# Patient Record
Sex: Female | Born: 1935 | Race: Black or African American | Hispanic: No | Marital: Single | Smoking: Never smoker
Health system: Southern US, Community
[De-identification: ages and names within clinical notes are randomized; demographics above are authoritative.]

## PROBLEM LIST (undated history)

## (undated) DIAGNOSIS — I509 Heart failure, unspecified: Secondary | ICD-10-CM

## (undated) DIAGNOSIS — I10 Essential (primary) hypertension: Secondary | ICD-10-CM

## (undated) DIAGNOSIS — K219 Gastro-esophageal reflux disease without esophagitis: Secondary | ICD-10-CM

## (undated) DIAGNOSIS — Z8639 Personal history of other endocrine, nutritional and metabolic disease: Secondary | ICD-10-CM

## (undated) DIAGNOSIS — K8020 Calculus of gallbladder without cholecystitis without obstruction: Secondary | ICD-10-CM

## (undated) DIAGNOSIS — E7800 Pure hypercholesterolemia, unspecified: Secondary | ICD-10-CM

## (undated) DIAGNOSIS — E119 Type 2 diabetes mellitus without complications: Secondary | ICD-10-CM

## (undated) DIAGNOSIS — I4891 Unspecified atrial fibrillation: Secondary | ICD-10-CM

## (undated) HISTORY — PX: CORONARY ARTERY BYPASS GRAFT: SHX141

---

## 2013-10-21 ENCOUNTER — Inpatient Hospital Stay (HOSPITAL_BASED_OUTPATIENT_CLINIC_OR_DEPARTMENT_OTHER)
Admission: EM | Admit: 2013-10-21 | Discharge: 2013-11-12 | DRG: 296 | Disposition: E | Payer: Medicare Other | Attending: Internal Medicine | Admitting: Internal Medicine

## 2013-10-21 ENCOUNTER — Emergency Department (HOSPITAL_BASED_OUTPATIENT_CLINIC_OR_DEPARTMENT_OTHER): Payer: Medicare Other

## 2013-10-21 ENCOUNTER — Encounter (HOSPITAL_BASED_OUTPATIENT_CLINIC_OR_DEPARTMENT_OTHER): Payer: Self-pay | Admitting: Emergency Medicine

## 2013-10-21 ENCOUNTER — Inpatient Hospital Stay
Admission: RE | Admit: 2013-10-21 | Payer: Self-pay | Source: Other Acute Inpatient Hospital | Admitting: Family Medicine

## 2013-10-21 DIAGNOSIS — R1110 Vomiting, unspecified: Secondary | ICD-10-CM | POA: Diagnosis present

## 2013-10-21 DIAGNOSIS — J9600 Acute respiratory failure, unspecified whether with hypoxia or hypercapnia: Secondary | ICD-10-CM

## 2013-10-21 DIAGNOSIS — Y849 Medical procedure, unspecified as the cause of abnormal reaction of the patient, or of later complication, without mention of misadventure at the time of the procedure: Secondary | ICD-10-CM | POA: Diagnosis not present

## 2013-10-21 DIAGNOSIS — E785 Hyperlipidemia, unspecified: Secondary | ICD-10-CM | POA: Diagnosis present

## 2013-10-21 DIAGNOSIS — Y9210 Unspecified residential institution as the place of occurrence of the external cause: Secondary | ICD-10-CM | POA: Diagnosis not present

## 2013-10-21 DIAGNOSIS — E7800 Pure hypercholesterolemia, unspecified: Secondary | ICD-10-CM | POA: Diagnosis present

## 2013-10-21 DIAGNOSIS — R112 Nausea with vomiting, unspecified: Secondary | ICD-10-CM | POA: Diagnosis present

## 2013-10-21 DIAGNOSIS — Z951 Presence of aortocoronary bypass graft: Secondary | ICD-10-CM

## 2013-10-21 DIAGNOSIS — J95811 Postprocedural pneumothorax: Secondary | ICD-10-CM | POA: Diagnosis not present

## 2013-10-21 DIAGNOSIS — N390 Urinary tract infection, site not specified: Secondary | ICD-10-CM | POA: Diagnosis present

## 2013-10-21 DIAGNOSIS — I509 Heart failure, unspecified: Secondary | ICD-10-CM | POA: Diagnosis present

## 2013-10-21 DIAGNOSIS — Z7901 Long term (current) use of anticoagulants: Secondary | ICD-10-CM

## 2013-10-21 DIAGNOSIS — S270XXA Traumatic pneumothorax, initial encounter: Secondary | ICD-10-CM

## 2013-10-21 DIAGNOSIS — Z79899 Other long term (current) drug therapy: Secondary | ICD-10-CM

## 2013-10-21 DIAGNOSIS — I10 Essential (primary) hypertension: Secondary | ICD-10-CM | POA: Diagnosis present

## 2013-10-21 DIAGNOSIS — R570 Cardiogenic shock: Secondary | ICD-10-CM | POA: Diagnosis not present

## 2013-10-21 DIAGNOSIS — I469 Cardiac arrest, cause unspecified: Principal | ICD-10-CM

## 2013-10-21 DIAGNOSIS — R911 Solitary pulmonary nodule: Secondary | ICD-10-CM | POA: Diagnosis present

## 2013-10-21 DIAGNOSIS — Z8639 Personal history of other endocrine, nutritional and metabolic disease: Secondary | ICD-10-CM | POA: Diagnosis present

## 2013-10-21 DIAGNOSIS — I2510 Atherosclerotic heart disease of native coronary artery without angina pectoris: Secondary | ICD-10-CM | POA: Diagnosis present

## 2013-10-21 DIAGNOSIS — E119 Type 2 diabetes mellitus without complications: Secondary | ICD-10-CM | POA: Diagnosis present

## 2013-10-21 DIAGNOSIS — Z87891 Personal history of nicotine dependence: Secondary | ICD-10-CM

## 2013-10-21 DIAGNOSIS — I5032 Chronic diastolic (congestive) heart failure: Secondary | ICD-10-CM | POA: Diagnosis present

## 2013-10-21 DIAGNOSIS — D649 Anemia, unspecified: Secondary | ICD-10-CM | POA: Diagnosis present

## 2013-10-21 DIAGNOSIS — K219 Gastro-esophageal reflux disease without esophagitis: Secondary | ICD-10-CM | POA: Diagnosis present

## 2013-10-21 DIAGNOSIS — R7889 Finding of other specified substances, not normally found in blood: Secondary | ICD-10-CM | POA: Diagnosis present

## 2013-10-21 DIAGNOSIS — I4891 Unspecified atrial fibrillation: Secondary | ICD-10-CM | POA: Diagnosis present

## 2013-10-21 DIAGNOSIS — E872 Acidosis, unspecified: Secondary | ICD-10-CM | POA: Diagnosis present

## 2013-10-21 DIAGNOSIS — D509 Iron deficiency anemia, unspecified: Secondary | ICD-10-CM | POA: Diagnosis present

## 2013-10-21 DIAGNOSIS — R197 Diarrhea, unspecified: Secondary | ICD-10-CM | POA: Diagnosis present

## 2013-10-21 DIAGNOSIS — R5381 Other malaise: Secondary | ICD-10-CM | POA: Diagnosis present

## 2013-10-21 DIAGNOSIS — R319 Hematuria, unspecified: Secondary | ICD-10-CM | POA: Diagnosis present

## 2013-10-21 DIAGNOSIS — R17 Unspecified jaundice: Secondary | ICD-10-CM | POA: Diagnosis present

## 2013-10-21 DIAGNOSIS — E860 Dehydration: Secondary | ICD-10-CM | POA: Diagnosis present

## 2013-10-21 DIAGNOSIS — R5383 Other fatigue: Secondary | ICD-10-CM

## 2013-10-21 DIAGNOSIS — I059 Rheumatic mitral valve disease, unspecified: Secondary | ICD-10-CM | POA: Diagnosis present

## 2013-10-21 DIAGNOSIS — R531 Weakness: Secondary | ICD-10-CM

## 2013-10-21 DIAGNOSIS — K8020 Calculus of gallbladder without cholecystitis without obstruction: Secondary | ICD-10-CM | POA: Diagnosis present

## 2013-10-21 DIAGNOSIS — N179 Acute kidney failure, unspecified: Secondary | ICD-10-CM | POA: Diagnosis present

## 2013-10-21 DIAGNOSIS — R892 Abnormal level of other drugs, medicaments and biological substances in specimens from other organs, systems and tissues: Secondary | ICD-10-CM

## 2013-10-21 HISTORY — DX: Gastro-esophageal reflux disease without esophagitis: K21.9

## 2013-10-21 HISTORY — DX: Personal history of other endocrine, nutritional and metabolic disease: Z86.39

## 2013-10-21 HISTORY — DX: Essential (primary) hypertension: I10

## 2013-10-21 HISTORY — DX: Calculus of gallbladder without cholecystitis without obstruction: K80.20

## 2013-10-21 HISTORY — DX: Unspecified atrial fibrillation: I48.91

## 2013-10-21 HISTORY — DX: Heart failure, unspecified: I50.9

## 2013-10-21 HISTORY — DX: Type 2 diabetes mellitus without complications: E11.9

## 2013-10-21 HISTORY — DX: Pure hypercholesterolemia, unspecified: E78.00

## 2013-10-21 LAB — URINALYSIS, ROUTINE W REFLEX MICROSCOPIC
BILIRUBIN URINE: NEGATIVE
Glucose, UA: NEGATIVE mg/dL
KETONES UR: NEGATIVE mg/dL
NITRITE: NEGATIVE
PH: 5.5 (ref 5.0–8.0)
Protein, ur: 100 mg/dL — AB
Specific Gravity, Urine: 1.016 (ref 1.005–1.030)
Urobilinogen, UA: 1 mg/dL (ref 0.0–1.0)

## 2013-10-21 LAB — GLUCOSE, CAPILLARY
GLUCOSE-CAPILLARY: 133 mg/dL — AB (ref 70–99)
Glucose-Capillary: 111 mg/dL — ABNORMAL HIGH (ref 70–99)

## 2013-10-21 LAB — CBC WITH DIFFERENTIAL/PLATELET
Basophils Absolute: 0.1 10*3/uL (ref 0.0–0.1)
Basophils Relative: 2 % — ABNORMAL HIGH (ref 0–1)
EOS ABS: 0.3 10*3/uL (ref 0.0–0.7)
EOS PCT: 5 % (ref 0–5)
HEMATOCRIT: 26.7 % — AB (ref 36.0–46.0)
HEMOGLOBIN: 8.6 g/dL — AB (ref 12.0–15.0)
LYMPHS ABS: 1.1 10*3/uL (ref 0.7–4.0)
Lymphocytes Relative: 17 % (ref 12–46)
MCH: 31.6 pg (ref 26.0–34.0)
MCHC: 32.2 g/dL (ref 30.0–36.0)
MCV: 98.2 fL (ref 78.0–100.0)
MONO ABS: 1.1 10*3/uL — AB (ref 0.1–1.0)
MONOS PCT: 17 % — AB (ref 3–12)
NEUTROS PCT: 60 % (ref 43–77)
Neutro Abs: 3.9 10*3/uL (ref 1.7–7.7)
Platelets: 204 10*3/uL (ref 150–400)
RBC: 2.72 MIL/uL — AB (ref 3.87–5.11)
RDW: 17.3 % — ABNORMAL HIGH (ref 11.5–15.5)
WBC: 6.5 10*3/uL (ref 4.0–10.5)

## 2013-10-21 LAB — URINE MICROSCOPIC-ADD ON

## 2013-10-21 LAB — RETICULOCYTES
RBC.: 2.48 MIL/uL — AB (ref 3.87–5.11)
Retic Count, Absolute: 111.6 10*3/uL (ref 19.0–186.0)
Retic Ct Pct: 4.5 % — ABNORMAL HIGH (ref 0.4–3.1)

## 2013-10-21 LAB — COMPREHENSIVE METABOLIC PANEL
ALBUMIN: 3.5 g/dL (ref 3.5–5.2)
ALT: 19 U/L (ref 0–35)
AST: 34 U/L (ref 0–37)
Alkaline Phosphatase: 162 U/L — ABNORMAL HIGH (ref 39–117)
BUN: 22 mg/dL (ref 6–23)
CO2: 18 mEq/L — ABNORMAL LOW (ref 19–32)
CREATININE: 1.5 mg/dL — AB (ref 0.50–1.10)
Calcium: 9.1 mg/dL (ref 8.4–10.5)
Chloride: 102 mEq/L (ref 96–112)
GFR calc non Af Amer: 31 mL/min — ABNORMAL LOW (ref >90)
GFR, EST AFRICAN AMERICAN: 36 mL/min — AB (ref >90)
GLUCOSE: 146 mg/dL — AB (ref 70–99)
Potassium: 4.5 mEq/L (ref 3.7–5.3)
Sodium: 138 mEq/L (ref 137–147)
TOTAL PROTEIN: 7.8 g/dL (ref 6.0–8.3)
Total Bilirubin: 1.5 mg/dL — ABNORMAL HIGH (ref 0.3–1.2)

## 2013-10-21 LAB — LIPASE, BLOOD: LIPASE: 47 U/L (ref 11–59)

## 2013-10-21 LAB — OCCULT BLOOD X 1 CARD TO LAB, STOOL: FECAL OCCULT BLD: NEGATIVE

## 2013-10-21 LAB — DIGOXIN LEVEL: DIGOXIN LVL: 2.6 ng/mL — AB (ref 0.8–2.0)

## 2013-10-21 LAB — TROPONIN I

## 2013-10-21 LAB — PRO B NATRIURETIC PEPTIDE: Pro B Natriuretic peptide (BNP): 4933 pg/mL — ABNORMAL HIGH (ref 0–450)

## 2013-10-21 MED ORDER — SODIUM CHLORIDE 0.9 % IV BOLUS (SEPSIS)
1000.0000 mL | Freq: Once | INTRAVENOUS | Status: AC
  Administered 2013-10-21: 1000 mL via INTRAVENOUS

## 2013-10-21 MED ORDER — ONDANSETRON HCL 4 MG PO TABS: 4.0000 mg | ORAL_TABLET | Freq: Four times a day (QID) | ORAL | Status: DC | PRN

## 2013-10-21 MED ORDER — ATORVASTATIN CALCIUM 20 MG PO TABS
20.0000 mg | ORAL_TABLET | Freq: Every day | ORAL | Status: DC
  Administered 2013-10-21 – 2013-10-23 (×3): 20 mg via ORAL
  Filled 2013-10-21 (×4): qty 1

## 2013-10-21 MED ORDER — AMLODIPINE BESYLATE 10 MG PO TABS
10.0000 mg | ORAL_TABLET | Freq: Every day | ORAL | Status: DC
Start: 2013-10-22 — End: 2013-10-24
  Administered 2013-10-22 – 2013-10-23 (×2): 10 mg via ORAL
  Filled 2013-10-21 (×3): qty 1

## 2013-10-21 MED ORDER — INSULIN ASPART 100 UNIT/ML ~~LOC~~ SOLN
0.0000 [IU] | Freq: Three times a day (TID) | SUBCUTANEOUS | Status: DC
  Administered 2013-10-23: 13:00:00 1 [IU] via SUBCUTANEOUS

## 2013-10-21 MED ORDER — DOCUSATE SODIUM 100 MG PO CAPS
100.0000 mg | ORAL_CAPSULE | Freq: Two times a day (BID) | ORAL | Status: DC
  Administered 2013-10-21 – 2013-10-23 (×4): 100 mg via ORAL
  Filled 2013-10-21 (×7): qty 1

## 2013-10-21 MED ORDER — SODIUM CHLORIDE 0.9 % IJ SOLN: 3.0000 mL | Freq: Two times a day (BID) | INTRAMUSCULAR | Status: DC

## 2013-10-21 MED ORDER — ONDANSETRON HCL 4 MG/2ML IJ SOLN: 4.0000 mg | Freq: Three times a day (TID) | INTRAMUSCULAR | Status: DC | PRN

## 2013-10-21 MED ORDER — DEXTROSE 5 % IV SOLN
1.0000 g | INTRAVENOUS | Status: DC
  Administered 2013-10-21 – 2013-10-23 (×3): 1 g via INTRAVENOUS
  Filled 2013-10-21 (×3): qty 10

## 2013-10-21 MED ORDER — ONDANSETRON HCL 4 MG/2ML IJ SOLN
4.0000 mg | Freq: Four times a day (QID) | INTRAMUSCULAR | Status: DC | PRN
  Administered 2013-10-21 – 2013-10-23 (×7): 4 mg via INTRAVENOUS
  Filled 2013-10-21 (×8): qty 2

## 2013-10-21 MED ORDER — ACETAMINOPHEN 650 MG RE SUPP: 650.0000 mg | Freq: Four times a day (QID) | RECTAL | Status: DC | PRN

## 2013-10-21 MED ORDER — SODIUM CHLORIDE 0.9 % IV SOLN
INTRAVENOUS | Status: DC
Start: 2013-10-21 — End: 2013-10-21

## 2013-10-21 MED ORDER — ACETAMINOPHEN 325 MG PO TABS
650.0000 mg | ORAL_TABLET | Freq: Four times a day (QID) | ORAL | Status: DC | PRN
  Administered 2013-10-22: 650 mg via ORAL
  Filled 2013-10-21: qty 2

## 2013-10-21 MED ORDER — SODIUM CHLORIDE 0.9 % IV SOLN
INTRAVENOUS | Status: DC
  Administered 2013-10-21 – 2013-10-23 (×4): via INTRAVENOUS

## 2013-10-21 MED ORDER — ONDANSETRON HCL 4 MG/2ML IJ SOLN
4.0000 mg | Freq: Once | INTRAMUSCULAR | Status: AC
  Administered 2013-10-21: 4 mg via INTRAVENOUS
  Filled 2013-10-21: qty 2

## 2013-10-21 MED ORDER — METOPROLOL TARTRATE 50 MG PO TABS
50.0000 mg | ORAL_TABLET | Freq: Two times a day (BID) | ORAL | Status: DC
  Administered 2013-10-21 – 2013-10-23 (×4): 50 mg via ORAL
  Filled 2013-10-21 (×7): qty 1

## 2013-10-21 MED ORDER — PANTOPRAZOLE SODIUM 40 MG PO TBEC
40.0000 mg | DELAYED_RELEASE_TABLET | Freq: Every day | ORAL | Status: DC
  Administered 2013-10-21 – 2013-10-23 (×3): 40 mg via ORAL
  Filled 2013-10-21 (×4): qty 1

## 2013-10-21 MED ORDER — MONTELUKAST SODIUM 10 MG PO TABS
10.0000 mg | ORAL_TABLET | Freq: Every day | ORAL | Status: DC
  Administered 2013-10-21 – 2013-10-23 (×3): 10 mg via ORAL
  Filled 2013-10-21 (×4): qty 1

## 2013-10-21 MED ORDER — DABIGATRAN ETEXILATE MESYLATE 75 MG PO CAPS
75.0000 mg | ORAL_CAPSULE | Freq: Two times a day (BID) | ORAL | Status: DC
  Administered 2013-10-21 – 2013-10-23 (×5): 75 mg via ORAL
  Filled 2013-10-21 (×7): qty 1

## 2013-10-21 MED ORDER — BISACODYL 10 MG RE SUPP: 10.0000 mg | Freq: Every day | RECTAL | Status: DC | PRN

## 2013-10-21 NOTE — ED Provider Notes (Signed)
CSN: [id number]     Arrival date & time 07/24/2014  0941 History   First MD Initiated Contact with Patient 010/13/2015 1047     Chief Complaint  Patient presents with  . Weakness  . Emesis   (Consider location/radiation/quality/duration/timing/severity/associated sxs/prior Treatment) HPI Comments: 80 [place] recently hospitalized for low glu/ dehydration, staying with daughter presents with general weakness, decreased appetite and vomiting after meals.  No abd surgery or SBO hx.  Pt tolerating fluid but less.  No fevers.  No new medicines, had bp meds adjusted a few weeks back.  Pt on digoxin for a fib.  CABG hx.  No CHF hx or weight gain.  No leg swelling.  Sxs all the time.  Worse with eating.    Patient is a 80 y.o. female presenting with weakness and vomiting. The history is provided by the patient.  Weakness This is a recurrent problem. Associated symptoms include shortness of breath (mild). Pertinent negatives include no chest pain, no abdominal pain and no headaches.  Emesis Associated symptoms: arthralgias   Associated symptoms: no abdominal pain, no chills, no diarrhea and no headaches     Past Medical History  Diagnosis Date  . Diabetes mellitus without complication   . Hypertension   . GERD (gastroesophageal reflux disease)   . Atrial fibrillation   . High cholesterol    Past Surgical History  Procedure Laterality Date  . Coronary artery bypass graft     No family history on file. History  Substance Use Topics  . Smoking status: Never Smoker   . Smokeless tobacco: Not on file  . Alcohol Use: Not on file   OB History   Grav Para Term Preterm Abortions TAB SAB Ect Mult Living                 Review of Systems  Constitutional: Positive for appetite change and fatigue. Negative for fever and chills.  HENT: Negative for congestion.   Eyes: Negative for visual disturbance.  Respiratory: Positive for cough and shortness of breath (mild).    Cardiovascular: Negative for chest pain.  Gastrointestinal: Positive for nausea and vomiting. Negative for abdominal pain, diarrhea and blood in stool.  Genitourinary: Negative for dysuria and flank pain.  Musculoskeletal: Positive for arthralgias. Negative for back pain, neck pain and neck stiffness.  Skin: Negative for rash.  Neurological: Positive for weakness. Negative for light-headedness and headaches.    Allergies  Review of patient's allergies indicates no known allergies.  Home Medications   Current Outpatient Rx  Name  Route  Sig  Dispense  Refill  . amLODipine (NORVASC) 10 MG tablet   Oral   Take 10 mg by mouth daily.         Marland Kitchen. atorvastatin (LIPITOR) 20 MG tablet   Oral   Take 20 mg by mouth daily.         . dabigatran (PRADAXA) 75 MG CAPS capsule   Oral   Take 75 mg by mouth 2 (two) times daily.         . digoxin (LANOXIN) 0.125 MG tablet   Oral   Take by mouth daily.         . metoprolol (LOPRESSOR) 50 MG tablet   Oral   Take 50 mg by mouth 2 (two) times daily.         . montelukast (SINGULAIR) 10 MG tablet   Oral   Take 10 mg by mouth at bedtime.  BP 104/60  Pulse 55  Temp(Src) 97.5 F (36.4 C) (Oral)  Resp 14  Ht 5\' 4"  (1.626 m)  Wt 140 lb (63.504 kg)  BMI 24.02 kg/m2  SpO2 95% Physical Exam  Nursing note and vitals reviewed. Constitutional: She is oriented to person, place, and time. She appears well-developed and well-nourished.  HENT:  Head: Normocephalic and atraumatic.  Dry mm  Eyes: Right eye exhibits no discharge. Left eye exhibits no discharge. No scleral icterus.  Neck: Normal range of motion. Neck supple. No tracheal deviation present.  Cardiovascular: Normal rate.  An irregularly irregular rhythm present.  Pulmonary/Chest: Effort normal. She has rales (few at bases bilateral).  Abdominal: Soft. She exhibits distension (mild). There is no tenderness. There is no guarding.  Musculoskeletal: She exhibits no  edema.  Neurological: She is alert and oriented to person, place, and time. No cranial nerve deficit. GCS eye subscore is 4. GCS verbal subscore is 5. GCS motor subscore is 6.  Equal  strength in UE and LE with f/e at major joints. Sensation to palpation intact in UE and LE. CNs 2-12 grossly intact.  EOMFI.  PERRL.   Finger nose and coordination intact bilateral.   Visual fields intact to finger testing. General weakness mild   Skin: Skin is warm. No rash noted.  Psychiatric: She has a normal mood and affect.    ED Course  Procedures (including critical care time) Labs Review Labs Reviewed  URINALYSIS, ROUTINE W REFLEX MICROSCOPIC - Abnormal; Notable for the following:    Color, Urine AMBER (*)    APPearance CLOUDY (*)    Hgb urine dipstick LARGE (*)    Protein, ur 100 (*)    Leukocytes, UA SMALL (*)    All other components within normal limits  CBC WITH DIFFERENTIAL - Abnormal; Notable for the following:    RBC 2.72 (*)    Hemoglobin 8.6 (*)    HCT 26.7 (*)    RDW 17.3 (*)    Monocytes Relative 17 (*)    Monocytes Absolute 1.1 (*)    Basophils Relative 2 (*)    All other components within normal limits  COMPREHENSIVE METABOLIC PANEL - Abnormal; Notable for the following:    CO2 18 (*)    Glucose, Bld 146 (*)    Creatinine, Ser 1.50 (*)    Alkaline Phosphatase 162 (*)    Total Bilirubin 1.5 (*)    GFR calc non Af Amer 31 (*)    GFR calc Af Amer 36 (*)    All other components within normal limits  GLUCOSE, CAPILLARY - Abnormal; Notable for the following:    Glucose-Capillary 133 (*)    All other components within normal limits  DIGOXIN LEVEL - Abnormal; Notable for the following:    Digoxin Level 2.6 (*)    All other components within normal limits  URINE MICROSCOPIC-ADD ON - Abnormal; Notable for the following:    Squamous Epithelial / LPF MANY (*)    Bacteria, UA MANY (*)    All other components within normal limits  URINE CULTURE  LIPASE, BLOOD  OCCULT BLOOD X  1 CARD TO LAB, STOOL   Imaging Review Dg Chest 2 View  10/17/2013   CLINICAL DATA:  Cough. chest congestion. Fever. Nausea and vomiting. Generalized weakness. Current history of hypertension and diabetes. Prior CABG.  EXAM: CHEST  2 VIEW  COMPARISON:  None.  FINDINGS: Cardiac silhouette moderately enlarged. Thoracic aorta atherosclerotic. Hilar and mediastinal contours otherwise unremarkable. Nodular opacity projected  over the lateral left upper lobe on the PA image. Lungs otherwise clear. No localized airspace consolidation. No pleural effusions. No pneumothorax. Normal pulmonary vascularity. Degenerative changes involving the thoracic spine an generalized osseous demineralization.  IMPRESSION: 1. Moderate cardiomegaly.  No acute cardiopulmonary disease. 2. Nodular opacity projected over the inferior left upper lobe on the PA image. This may be in the lung or within the anterior 4th rib, posterior 7th rib, or scapula as all these structures are overlapping in this region. Are there prior examinations for comparison? If not, a non-emergent CT chest is recommended.   Electronically Signed   By: Hulan Saas M.D.   On: 11/08/2013 10:41   Dg Abd 2 Views  10/17/2013   CLINICAL DATA:  Vomiting, cough, congestion, fever, weakness  EXAM: ABDOMEN - 2 VIEW  COMPARISON:  10/25/2013 chest x-ray A  FINDINGS: Scattered air and stool throughout the bowel. Negative for obstruction. No free air. Calcified gallstones in the right upper quadrant. Cardiomegaly noted with vascular congestion and probable basilar atelectasis. Coronary bypass changes noted. Mild curvature of the spine appears positional.  IMPRESSION: Negative for obstruction or free air  Calcified gallstones   Electronically Signed   By: Ruel Favors M.D.   On: 11/06/2013 11:58    EKG Interpretation   None       MDM   1. Weakness   2. Nodule of left lung   3. Atrial fibrillation   4. ARF (acute renal failure)   5. Elevated bilirubin   6.  Dehydration   7. Vomiting   8. Elevated digoxin level   9. Gallstones    Broad differential with general weakness/ Vomiting in elderly female- cardiac/ infectious/ metabolic/ medicine SE/ partial bowel obstruction/ GB, other. General weakness with anemia, requesting old records to see if new. Hemoccult of stool.  Labs and fluids in ED. CXR small nodule, pt will need fup outpt for CT scan.   Fluids in ED.   Pt not improved on recheck. Multiple findings on work up including dehydration, arf, worsening anemia (old records showed recent 10.5), hemocult neg.  Imaging small nodule.   Dig level elevated, likely from dehydration.  Gallstones on xray, no fever or lft elevation, pt will need formal US at [facility]. Spoke with Dr Cena Benton, accepted tele.  The patients results and plan were reviewed and discussed.   Any x-rays performed were personally reviewed by myself.   Differential diagnosis were considered with the presenting HPI.  Diagnosis: above  EKG: a fib  Admission/ observation were discussed with the admitting physician, patient and/or family and they are comfortable with the plan.    Enid Skeens, MD 11/11/2013 [phone]

## 2013-10-21 NOTE — Progress Notes (Signed)
Patient 80 y/o with multiple comorbidities presenting with dry mucous membranes and nausea.  Has elevated Digoxin level, elevated serum creatine 1.5, and worsening anemia 8.6 from 10.5 on last check. ED physician requesting admission for further evaluation and recommendations.  Of note patient had calcified gallstones on x ray.  Please consider obtaining RUQ U/S on admission for further evaluation.  Adriella Essex, [employer]

## 2013-10-21 NOTE — ED Notes (Signed)
[facility] here and has assumed care of the patient. 

## 2013-10-21 NOTE — ED Notes (Signed)
Patient here with increased weakness and vomiting after meals. D[place] and was recently hospitalized fpr hypoglycemia, been staying with daughter the past week. Denies any abdominal pain. Also concerned with facial swelling

## 2013-10-21 NOTE — H&P (Signed)
History and Physical Examination   Sabrina Hurley OZH:[id number] DOB: 01-15-1928 DOA: 11/09/2013  PCP: Dicie Beam, MD   Chief Complaint: nausea   HPI: Sabrina Hurley is a 80 y.o. female from [facility] with type 2 diabetes mellitus, congestive heart failure, hypertension and chronic anemia presented to the med [facility] today with family because they became concerned that the patient has been nauseated with poor oral intake for the past several days.  The patient had recently been hospitalized in [facility] with hypoglycemia and dehydration.  She is recently been staying with her daughter who became concerned because of her confusion and malaise.  For the past week she has been having episodes of intermittent confusion and complaints of nausea.  She has also been retching.  No abd surgery or SBO hx.  No fevers. No new medicines, had bp meds adjusted a few weeks back because of chronic cough. Pt on digoxin for a fib. CABG hx.  No leg swelling.  The patient reports that her symptoms are worse when she eats and when she takes medications.  She denies having constipation, diarrhea and abdominal pain.  She reports that she has not seen her cardiologist in a very long time.  She has also experienced generalized weakness.  In the emergency department she was evaluated and found to have an acute renal failure, anemia with a 2 point drop in hemoglobin and an elevated digoxin level.  She was given IV fluid hydration and hospital and hospital admission was requested for further evaluation and management.  She was also noted to have cholelithiasis which will require further evaluation.  Past Medical History Past Medical History  Diagnosis Date  . Diabetes mellitus without complication   . Hypertension   . GERD (gastroesophageal reflux disease)   . Atrial fibrillation   . High cholesterol   . History of hypoglycemia   . CHF (congestive heart failure)   . Cholelithiasis     Past  Surgical History Past Surgical History  Procedure Laterality Date  . Coronary artery bypass graft      Home Meds: Prior to Admission medications   Medication Sig Start Date End Date Taking? Authorizing Provider  acetaminophen (TYLENOL) 500 MG tablet Take 1,000 mg by mouth every 6 (six) hours as needed.   Yes Historical Provider, MD  amLODipine (NORVASC) 10 MG tablet Take 10 mg by mouth daily.   Yes Historical Provider, MD  atorvastatin (LIPITOR) 20 MG tablet Take 20 mg by mouth daily.   Yes Historical Provider, MD  dabigatran (PRADAXA) 75 MG CAPS capsule Take 75 mg by mouth 2 (two) times daily.   Yes Historical Provider, MD  digoxin (LANOXIN) 0.125 MG tablet Take by mouth daily.   Yes Historical Provider, MD  glimepiride (AMARYL) 2 MG tablet Take 1 tablet by mouth daily. 09/13/13  Yes Historical Provider, MD  JANUVIA 100 MG tablet Take 1 tablet by mouth daily. 09/12/13  Yes Historical Provider, MD  metoprolol (LOPRESSOR) 50 MG tablet Take 50 mg by mouth 2 (two) times daily.   Yes Historical Provider, MD  montelukast (SINGULAIR) 10 MG tablet Take 10 mg by mouth at bedtime.   Yes Historical Provider, MD  DEXILANT 30 MG capsule Take 1 capsule by mouth daily. 10/17/13   Historical Provider, MD   Allergies: Review of patient's allergies indicates no known allergies.  Social History:  History   Social History  . Marital Status: Single    Spouse Name: N/A    Number of Children:  N/A  . Years of Education: N/A   Occupational History  . Not on file.   Social History Main Topics  . Smoking status: Never Smoker   . Smokeless tobacco: Not on file  . Alcohol Use: Not on file  . Drug Use: Not on file  . Sexual Activity: Not on file   Other Topics Concern  . Not on file   Social History Narrative   The patient lives in [place] and is currently visiting daughter   Family History:  Family History  Problem Relation Age of Onset  . Hypertension      Review of Systems   Constitutional: Positive for appetite change and fatigue. Negative for fever and chills.  HENT: Negative for congestion.  Eyes: Negative for visual disturbance.  Respiratory: Positive for cough and shortness of breath (mild).  Cardiovascular: Negative for chest pain.  Gastrointestinal: Positive for nausea and vomiting. Negative for abdominal pain, diarrhea and blood in stool.  Genitourinary: Negative for dysuria and flank pain.  Musculoskeletal: Positive for arthralgias. Negative for back pain, neck pain and neck stiffness.  Skin: Negative for rash.  Neurological: Positive for weakness. Negative for light-headedness and headaches.  All other systems reviewed and reported as negative.   Physical Exam: Blood pressure 103/59, pulse 76, temperature 97.6 F (36.4 C), temperature source Oral, resp. rate 16, height 5\' 4"  (1.626 m), weight 146 lb 13.2 oz (66.6 kg), SpO2 100.00%. Nursing note and vitals reviewed.  Constitutional: She is oriented to person, place, and time. She appears well-developed and well-nourished.  HENT: Head: Normocephalic and atraumatic. Dry mm  Eyes: Right eye exhibits no discharge. Left eye exhibits no discharge. No scleral icterus.  Neck: Normal range of motion. Neck supple. No tracheal deviation present.  Cardiovascular: Normal rate. An irregularly irregular rhythm present.  Pulmonary/Chest: Effort normal. She has rales (few at bases bilateral).  Abdominal: Soft. She exhibits distension (mild). There is no tenderness. There is no guarding.  Musculoskeletal: She exhibits no edema.  Neurological: She is alert and oriented to person, place, and time. No cranial nerve deficit. Equal strength in UE and LE with f/e at major joints. Sensation to palpation intact in UE and LE. CNs 2-12 grossly intact. EOMI. PERRL.  Finger nose and coordination intact bilateral.  General weakness mild Skin: Skin is warm. No rash noted.  Psychiatric: She has a normal mood and affect.   Lab   And Imaging results:  Results for orders placed during the hospital encounter of 11-19-2013 (from the past 24 hour(s))  URINALYSIS, ROUTINE W REFLEX MICROSCOPIC     Status: Abnormal   Collection Time    11-19-13 10:09 AM      Result Value Range   Color, Urine AMBER (*) YELLOW   APPearance CLOUDY (*) CLEAR   Specific Gravity, Urine 1.016  1.005 - 1.030   pH 5.5  5.0 - 8.0   Glucose, UA NEGATIVE  NEGATIVE mg/dL   Hgb urine dipstick LARGE (*) NEGATIVE   Bilirubin Urine NEGATIVE  NEGATIVE   Ketones, ur NEGATIVE  NEGATIVE mg/dL   Protein, ur 454 (*) NEGATIVE mg/dL   Urobilinogen, UA 1.0  0.0 - 1.0 mg/dL   Nitrite NEGATIVE  NEGATIVE   Leukocytes, UA SMALL (*) NEGATIVE  URINE MICROSCOPIC-ADD ON     Status: Abnormal   Collection Time    Nov 19, 2013 10:09 AM      Result Value Range   Squamous Epithelial / LPF MANY (*) RARE   WBC, UA 3-6  <  3 WBC/hpf   RBC / HPF TOO NUMEROUS TO COUNT  <3 RBC/hpf   Bacteria, UA MANY (*) RARE  GLUCOSE, CAPILLARY     Status: Abnormal   Collection Time    2013-11-03 10:14 AM      Result Value Range   Glucose-Capillary 133 (*) 70 - 99 mg/dL   Comment 1 Documented in Chart     Comment 2 Notify RN    CBC WITH DIFFERENTIAL     Status: Abnormal   Collection Time    2013/11/03 10:35 AM      Result Value Range   WBC 6.5  4.0 - 10.5 K/uL   RBC 2.72 (*) 3.87 - 5.11 MIL/uL   Hemoglobin 8.6 (*) 12.0 - 15.0 g/dL   HCT 16.1 (*) 09.6 - 04.5 %   MCV 98.2  78.0 - 100.0 fL   MCH 31.6  26.0 - 34.0 pg   MCHC 32.2  30.0 - 36.0 g/dL   RDW 40.9 (*) 81.1 - 91.4 %   Platelets 204  150 - 400 K/uL   Neutrophils Relative % 60  43 - 77 %   Neutro Abs 3.9  1.7 - 7.7 K/uL   Lymphocytes Relative 17  12 - 46 %   Lymphs Abs 1.1  0.7 - 4.0 K/uL   Monocytes Relative 17 (*) 3 - 12 %   Monocytes Absolute 1.1 (*) 0.1 - 1.0 K/uL   Eosinophils Relative 5  0 - 5 %   Eosinophils Absolute 0.3  0.0 - 0.7 K/uL   Basophils Relative 2 (*) 0 - 1 %   Basophils Absolute 0.1  0.0 - 0.1 K/uL   COMPREHENSIVE METABOLIC PANEL     Status: Abnormal   Collection Time    03-Nov-2013 10:35 AM      Result Value Range   Sodium 138  137 - 147 mEq/L   Potassium 4.5  3.7 - 5.3 mEq/L   Chloride 102  96 - 112 mEq/L   CO2 18 (*) 19 - 32 mEq/L   Glucose, Bld 146 (*) 70 - 99 mg/dL   BUN 22  6 - 23 mg/dL   Creatinine, Ser 7.82 (*) 0.50 - 1.10 mg/dL   Calcium 9.1  8.4 - 95.6 mg/dL   Total Protein 7.8  6.0 - 8.3 g/dL   Albumin 3.5  3.5 - 5.2 g/dL   AST 34  0 - 37 U/L   ALT 19  0 - 35 U/L   Alkaline Phosphatase 162 (*) 39 - 117 U/L   Total Bilirubin 1.5 (*) 0.3 - 1.2 mg/dL   GFR calc non Af Amer 31 (*) >90 mL/min   GFR calc Af Amer 36 (*) >90 mL/min  LIPASE, BLOOD     Status: None   Collection Time    November 03, 2013 10:35 AM      Result Value Range   Lipase 47  11 - 59 U/L  DIGOXIN LEVEL     Status: Abnormal   Collection Time    11/03/2013 10:35 AM      Result Value Range   Digoxin Level 2.6 (*) 0.8 - 2.0 ng/mL  OCCULT BLOOD X 1 CARD TO LAB, STOOL     Status: None   Collection Time    2013/11/03 12:00 PM      Result Value Range   Fecal Occult Bld NEGATIVE  NEGATIVE     Impression / Plan    ARF (acute renal failure) / Dehydration - will treat with gentle  IV fluid hydration, monitor ins and outs, daily weights and electrolytes daily   Nausea and Vomiting - IV nausea medications ordered   Atrial fibrillation - rate currently controlled, anticoagulated with Pradaxa, will hold digoxin, repeat level in a.m. after IV fluid hydration   Hematuria - urine culture ordered, will empirically start IV ceftriaxone pending culture results   Weakness-consult PT / OT for evaluation and management recommendations   Anemia - hemoccult stool x 3, monitor CBC, check anemia panel    Gallstones - check U/S of abdomen    Nodule of left lung - discussed with patient, will need outpatient followup imaging with PCP    Elevated bilirubin - recheck in AM, ABD US order as above    Elevated digoxin level - mildly elevated  in setting of ARF and dehydration, will hydrate now, repeat level in AM, hold dig for now   CHF (congestive heart failure) per patient history, monitor I/O, weights, fluids, check echocardiogram    History of hypoglycemia - monitor CBG closely   High cholesterol - check fasting lipid panel    GERD (gastroesophageal reflux disease) - protonix daily for GI prophylaxis    Hypertension - resume home medications    Type 2 diabetes mellitus - close monitoring of CBG as above, supplemental insulin as needed for elevated BS readings  Standley Dakinslanford Alekzander Cardell MD [facility] [place] [phone] 05-12-2014, 5:07 PM

## 2013-10-22 ENCOUNTER — Inpatient Hospital Stay (HOSPITAL_COMMUNITY): Payer: Medicare Other

## 2013-10-22 DIAGNOSIS — I059 Rheumatic mitral valve disease, unspecified: Secondary | ICD-10-CM

## 2013-10-22 LAB — TROPONIN I: Troponin I: <0.3 ng/mL (ref <0.30)

## 2013-10-22 LAB — IRON AND TIBC
IRON: 25 ug/dL — AB (ref 42–135)
SATURATION RATIOS: 8 % — AB (ref 20–55)
TIBC: 304 ug/dL (ref 250–470)
UIBC: 279 ug/dL (ref 125–400)

## 2013-10-22 LAB — CBC
HEMATOCRIT: 23.4 % — AB (ref 36.0–46.0)
Hemoglobin: 8 g/dL — ABNORMAL LOW (ref 12.0–15.0)
MCH: 32.5 pg (ref 26.0–34.0)
MCHC: 34.2 g/dL (ref 30.0–36.0)
MCV: 95.1 fL (ref 78.0–100.0)
Platelets: 195 10*3/uL (ref 150–400)
RBC: 2.46 MIL/uL — ABNORMAL LOW (ref 3.87–5.11)
RDW: 17.8 % — AB (ref 11.5–15.5)
WBC: 6.5 10*3/uL (ref 4.0–10.5)

## 2013-10-22 LAB — GLUCOSE, CAPILLARY
GLUCOSE-CAPILLARY: 104 mg/dL — AB (ref 70–99)
Glucose-Capillary: 95 mg/dL (ref 70–99)
Glucose-Capillary: 99 mg/dL (ref 70–99)
Glucose-Capillary: 123 mg/dL — ABNORMAL HIGH (ref 70–99)

## 2013-10-22 LAB — COMPREHENSIVE METABOLIC PANEL
ALBUMIN: 2.9 g/dL — AB (ref 3.5–5.2)
ALK PHOS: 137 U/L — AB (ref 39–117)
ALT: 17 U/L (ref 0–35)
AST: 29 U/L (ref 0–37)
BUN: 25 mg/dL — ABNORMAL HIGH (ref 6–23)
CO2: 19 mEq/L (ref 19–32)
Calcium: 8.3 mg/dL — ABNORMAL LOW (ref 8.4–10.5)
Chloride: 107 mEq/L (ref 96–112)
Creatinine, Ser: 1.75 mg/dL — ABNORMAL HIGH (ref 0.50–1.10)
GFR calc Af Amer: 30 mL/min — ABNORMAL LOW (ref >90)
GFR calc non Af Amer: 26 mL/min — ABNORMAL LOW (ref >90)
Glucose, Bld: 101 mg/dL — ABNORMAL HIGH (ref 70–99)
POTASSIUM: 4.9 meq/L (ref 3.7–5.3)
Sodium: 138 mEq/L (ref 137–147)
Total Bilirubin: 1.6 mg/dL — ABNORMAL HIGH (ref 0.3–1.2)
Total Protein: 6.8 g/dL (ref 6.0–8.3)

## 2013-10-22 LAB — VITAMIN B12

## 2013-10-22 LAB — HEMOGLOBIN A1C
HEMOGLOBIN A1C: 5.9 % — AB (ref <5.7)
Mean Plasma Glucose: 123 mg/dL — ABNORMAL HIGH (ref <117)

## 2013-10-22 LAB — URINE CULTURE
COLONY COUNT: NO GROWTH
CULTURE: NO GROWTH

## 2013-10-22 LAB — TSH: TSH: 2.559 u[IU]/mL (ref 0.350–4.500)

## 2013-10-22 LAB — DIGOXIN LEVEL: Digoxin Level: 2.1 ng/mL — ABNORMAL HIGH (ref 0.8–2.0)

## 2013-10-22 LAB — FERRITIN: Ferritin: 35 ng/mL (ref 10–291)

## 2013-10-22 LAB — FOLATE

## 2013-10-22 NOTE — Evaluation (Signed)
Physical Therapy Evaluation Patient Details Name: Sabrina Hurley MRN: [id number] DOB: [birth year] Today's Date: 10/22/2013 Time: 4098-1191 PT Time Calculation (min): 18 min  PT Assessment / Plan / Recommendation History of Present Illness  80 y.o. female from [facility] with type 2 diabetes mellitus, congestive heart failure, hypertension and chronic anemia presented to the med [employer] [facility] today with family because they became concerned that the patient has been nauseated with poor oral intake for the past several days.  The patient had recently been hospitalized in [facility] with hypoglycemia and dehydration.  She is recently been staying with her daughter who became concerned because of her confusion and malaise.  For the past week she has been having episodes of intermittent confusion and complaints of nausea.   Clinical Impression  Pt admitted with diagnosis of acute renal failure. Pt currently with functional limitations due to the deficits listed below (see PT Problem List). Pt will benefit from skilled PT to increase their independence and safety with mobility to allow discharge to the venue listed below.  Pt and daughter will be discussing d/c plans.  Daughter can provide 24 hour A if pt d/c to her house.       PT Assessment  Patient needs continued PT services    Follow Up Recommendations  Home health PT    Does the patient have the potential to tolerate intense rehabilitation      Barriers to Discharge        Equipment Recommendations  Rolling walker with 5" wheels    Recommendations for Other Services     Frequency Min 3X/week    Precautions / Restrictions Precautions Precautions: Fall Restrictions Weight Bearing Restrictions: No   Pertinent Vitals/Pain No pain      Mobility  Bed Mobility Overal bed mobility: Needs Assistance Bed Mobility: Sit to Supine Sit to supine: Supervision Transfers Overall transfer level: Needs  assistance Transfers: Sit to/from Stand Sit to Stand: Min guard General transfer comment: cueing for proper hand placement Ambulation/Gait Ambulation/Gait assistance: Min guard;Min assist Ambulation Distance (Feet): 90 Feet Assistive device:  (pushing IV pole) Gait Pattern/deviations: Decreased step length - right;Decreased step length - left;Drifts right/left (drifting when she didn't have IV pole) General Gait Details: Pt ambulated with IV pole, but did a trial without IV pole and pt with decreased balance and pt ended up reaching for rails.      Exercises     PT Diagnosis: Difficulty walking  PT Problem List: Decreased activity tolerance;Decreased balance;Decreased mobility PT Treatment Interventions: Gait training;Stair training;Functional mobility training;Therapeutic activities;Therapeutic exercise     PT Goals(Current goals can be found in the care plan section) Acute Rehab PT Goals Patient Stated Goal: To get stronger.  Daughter would like pt to go home with her. PT Goal Formulation: With patient/family Time For Goal Achievement: 11/05/13 Potential to Achieve Goals: Good  Visit Information  Last PT Received On: 10/22/13 Assistance Needed: +1 History of Present Illness: 80 y.o. female from [facility] with type 2 diabetes mellitus, congestive heart failure, hypertension and chronic anemia presented to the med [employer] today with family because they became concerned that the patient has been nauseated with poor oral intake for the past several days.  The patient had recently been hospitalized in [facility] with hypoglycemia and dehydration.  She is recently been staying with her daughter who became concerned because of her confusion and malaise.  For the past week she has been having episodes of intermittent confusion and complaints  of nausea.        Prior Functioning       Cognition  Cognition Arousal/Alertness: Awake/alert Behavior During Therapy:  WFL for tasks assessed/performed Overall Cognitive Status: Within Functional Limits for tasks assessed    Extremity/Trunk Assessment Upper Extremity Assessment Upper Extremity Assessment: Defer to OT evaluation Lower Extremity Assessment Lower Extremity Assessment: Generalized weakness Cervical / Trunk Assessment Cervical / Trunk Assessment: Normal   Balance Balance Overall balance assessment: Needs assistance Standing balance-Leahy Scale: Fair  End of Session PT - End of Session Equipment Utilized During Treatment: Gait belt Activity Tolerance: Patient tolerated treatment well Patient left: in bed;with call bell/phone within reach;with family/visitor present Nurse Communication: Mobility status  GP     Sabrina Hurley 10/22/2013, 10:46 AM

## 2013-10-22 NOTE — Progress Notes (Addendum)
[facility] PROGRESS NOTE  Sabrina Hurley ZOX:[id number] DOB: 1928-02-28 DOA: 10/27/2013 PCP: Dicie BeamMANGRUM,CHARLITA, MD  Assessment/Plan: ARF (acute renal failure) / Dehydration - cont IV fluid hydration as tolerated, monitor ins and outs, daily weights and electrolytes daily  - Creatinine continues to rise in the setting of active diarrhea - Will cautiously increase IVF from 75cc to 100cc/hr Nausea and Vomiting - PRN anitemetics Atrial fibrillation - rate currently controlled, anticoagulated with Pradaxa, holding digoxin Hematuria - urine culture ordered with no growth noted, empirically started IV ceftriaxone, consider d/c abx given neg culture, no fevers, no leukocytosis Weakness-PT / OT consulted  Iron deficiency Anemia - hemoccult stool x 3, follow CBC, Iron of 25. Will start oral iron supplimentation  Gallstones - noted on abd US without signs of obstruction or Murphy's sign, Surgery consulted Nodule of left lung - discussed with patient, will need outpatient followup imaging with PCP  Elevated bilirubin - stable, ABD US order as above  Elevated digoxin level - mildly elevated in setting of ARF and dehydration, will hydrate now,  cont to monitor, would resume digoxin when levels therapeutic CHF (congestive heart failure) per patient history, monitor I/O, weights, fluids, check echocardiogram, mod to severe MR with normal EF noted History of hypoglycemia - monitor CBG closely  High cholesterol - check fasting lipid panel  GERD (gastroesophageal reflux disease) - protonix daily for GI prophylaxis  Hypertension - resume home medications  Type 2 diabetes mellitus - close monitoring of CBG as above, supplemental insulin as needed for elevated BS readings Diarrhea - unclear etiology - doubt cdiff given lack of fever, abd tenderness, and no leukocytosis - supportive care for now with IVF as tolerated - Appears to be improving  Code Status: Full Family Communication: Pt in room  (indicate person spoken with, relationship, and if by phone, the number) Disposition Plan: Pending  Consultants:  Surgery  Procedures:  none  Antibiotics:  none   HPI/Subjective: No acute events noted overnight. Still having loose stools  Objective: Filed Vitals:   10/20/2013 1345 11/03/2013 1552 10/29/2013 2130 10/22/13 0615  BP: 112/69 103/59 103/49 116/58  Pulse:  76 63 63  Temp: 97.6 F (36.4 C) 97.6 F (36.4 C) 98 F (36.7 C) 98.3 F (36.8 C)  TempSrc: Oral Oral Oral Oral  Resp: 15 16 18 16   Height: 5\' 4"  (1.626 m) 5\' 4"  (1.626 m)    Weight: 66.6 kg (146 lb 13.2 oz) 66.6 kg (146 lb 13.2 oz)    SpO2: 97% 100% 96% 99%    Intake/Output Summary (Last 24 hours) at 10/22/13 1051 Last data filed at 10/22/13 0657  Gross per 24 hour  Intake 1116.25 ml  Output    100 ml  Net 1016.25 ml   Filed Weights   11/07/2013 0959 10/28/2013 1345 11/09/2013 1552  Weight: 63.504 kg (140 lb) 66.6 kg (146 lb 13.2 oz) 66.6 kg (146 lb 13.2 oz)    Exam:   General:  Awake, in nad  Cardiovascular: regular, s1, s2  Respiratory: normal resp effort, no wheezing  Abdomen: soft, nondistended  Musculoskeletal: perfused, no clubbing   Data Reviewed: Basic Metabolic Panel:  Recent Labs Lab 11/10/2013 1035 10/22/13 0536  NA 138 138  K 4.5 4.9  CL 102 107  CO2 18* 19  GLUCOSE 146* 101*  BUN 22 25*  CREATININE 1.50* 1.75*  CALCIUM 9.1 8.3*   Liver Function Tests:  Recent Labs Lab 11/05/2013 1035 10/22/13 0536  AST 34 29  ALT 19 17  ALKPHOS  162* 137*  BILITOT 1.5* 1.6*  PROT 7.8 6.8  ALBUMIN 3.5 2.9*    Recent Labs Lab 11-06-13 1035  LIPASE 47   No results found for this basename: AMMONIA,  in the last 168 hours CBC:  Recent Labs Lab November 06, 2013 1035 10/22/13 0536  WBC 6.5 6.5  NEUTROABS 3.9  --   HGB 8.6* 8.0*  HCT 26.7* 23.4*  MCV 98.2 95.1  PLT 204 195   Cardiac Enzymes:  Recent Labs Lab 11-06-2013 1905 11/06/2013 2328 10/22/13 0536  TROPONINI <0.30 <0.30  <0.30   BNP (last 3 results)  Recent Labs  2013-11-06 1905  PROBNP 4933.0*   CBG:  Recent Labs Lab 06-Nov-2013 1014 Nov 06, 2013 2127 10/22/13 0818  GLUCAP 133* 111* 95    No results found for this or any previous visit (from the past 240 hour(s)).   Studies: Dg Chest 2 View  11-06-2013   CLINICAL DATA:  Cough. chest congestion. Fever. Nausea and vomiting. Generalized weakness. Current history of hypertension and diabetes. Prior CABG.  EXAM: CHEST  2 VIEW  COMPARISON:  None.  FINDINGS: Cardiac silhouette moderately enlarged. Thoracic aorta atherosclerotic. Hilar and mediastinal contours otherwise unremarkable. Nodular opacity projected over the lateral left upper lobe on the PA image. Lungs otherwise clear. No localized airspace consolidation. No pleural effusions. No pneumothorax. Normal pulmonary vascularity. Degenerative changes involving the thoracic spine an generalized osseous demineralization.  IMPRESSION: 1. Moderate cardiomegaly.  No acute cardiopulmonary disease. 2. Nodular opacity projected over the inferior left upper lobe on the PA image. This may be in the lung or within the anterior 4th rib, posterior 7th rib, or scapula as all these structures are overlapping in this region. Are there prior examinations for comparison? If not, a non-emergent CT chest is recommended.   Electronically Signed   By: Hulan Saas M.D.   On: 11/06/2013 10:41   Dg Abd 2 Views  11/06/13   CLINICAL DATA:  Vomiting, cough, congestion, fever, weakness  EXAM: ABDOMEN - 2 VIEW  COMPARISON:  2013-11-06 chest x-ray A  FINDINGS: Scattered air and stool throughout the bowel. Negative for obstruction. No free air. Calcified gallstones in the right upper quadrant. Cardiomegaly noted with vascular congestion and probable basilar atelectasis. Coronary bypass changes noted. Mild curvature of the spine appears positional.  IMPRESSION: Negative for obstruction or free air  Calcified gallstones   Electronically Signed    By: Ruel Favors M.D.   On: Nov 06, 2013 11:58    Scheduled Meds: . amLODipine  10 mg Oral Daily  . atorvastatin  20 mg Oral Daily  . cefTRIAXone (ROCEPHIN)  IV  1 g Intravenous Q24H  . dabigatran  75 mg Oral BID  . docusate sodium  100 mg Oral BID  . insulin aspart  0-9 Units Subcutaneous TID WC  . metoprolol  50 mg Oral BID  . montelukast  10 mg Oral QHS  . pantoprazole  40 mg Oral Daily  . sodium chloride  3 mL Intravenous Q12H   Continuous Infusions: . sodium chloride 75 mL/hr at 10/22/13 9811    Principal Problem:   ARF (acute renal failure) Active Problems:   Vomiting   Atrial fibrillation   Dehydration   Weakness   Anemia   Gallstones   Nodule of left lung   Elevated bilirubin   Elevated digoxin level   CHF (congestive heart failure)   History of hypoglycemia   High cholesterol   GERD (gastroesophageal reflux disease)   Hypertension   Type 2 diabetes mellitus  Hematuria   UTI (urinary tract infection)   CAD (coronary atherosclerotic disease)   S/P CABG (coronary artery bypass graft)  Time spent:  CHIU, STEPHEN K  [facility] Pager [phone]. If 7PM-7AM, please contact night-coverage at [email or web address], password [facility] 10/22/2013, 10:51 AM  LOS: 1 day

## 2013-10-22 NOTE — Progress Notes (Signed)
Echocardiogram 2D Echocardiogram has been performed.  Sabrina Hurley, Sabrina Hurley 10/22/2013, 9:22 AM

## 2013-10-23 ENCOUNTER — Inpatient Hospital Stay (HOSPITAL_COMMUNITY): Payer: Medicare Other | Admitting: Registered Nurse

## 2013-10-23 ENCOUNTER — Encounter (HOSPITAL_COMMUNITY): Payer: Medicare Other | Admitting: Registered Nurse

## 2013-10-23 DIAGNOSIS — K8020 Calculus of gallbladder without cholecystitis without obstruction: Secondary | ICD-10-CM

## 2013-10-23 LAB — CBC
HCT: 24.6 % — ABNORMAL LOW (ref 36.0–46.0)
Hemoglobin: 8.2 g/dL — ABNORMAL LOW (ref 12.0–15.0)
MCH: 32.2 pg (ref 26.0–34.0)
MCHC: 33.3 g/dL (ref 30.0–36.0)
MCV: 96.5 fL (ref 78.0–100.0)
Platelets: 207 10*3/uL (ref 150–400)
RBC: 2.55 MIL/uL — AB (ref 3.87–5.11)
RDW: 18.1 % — AB (ref 11.5–15.5)
WBC: 6.7 10*3/uL (ref 4.0–10.5)

## 2013-10-23 LAB — BASIC METABOLIC PANEL
BUN: 29 mg/dL — ABNORMAL HIGH (ref 6–23)
CALCIUM: 8.5 mg/dL (ref 8.4–10.5)
CHLORIDE: 107 meq/L (ref 96–112)
CO2: 16 mEq/L — ABNORMAL LOW (ref 19–32)
CREATININE: 1.9 mg/dL — AB (ref 0.50–1.10)
GFR calc non Af Amer: 23 mL/min — ABNORMAL LOW (ref >90)
GFR, EST AFRICAN AMERICAN: 27 mL/min — AB (ref >90)
Glucose, Bld: 103 mg/dL — ABNORMAL HIGH (ref 70–99)
Potassium: 4.7 mEq/L (ref 3.7–5.3)
Sodium: 139 mEq/L (ref 137–147)

## 2013-10-23 LAB — GLUCOSE, CAPILLARY
GLUCOSE-CAPILLARY: 102 mg/dL — AB (ref 70–99)
GLUCOSE-CAPILLARY: 94 mg/dL (ref 70–99)
Glucose-Capillary: 135 mg/dL — ABNORMAL HIGH (ref 70–99)

## 2013-10-23 LAB — APTT: APTT: 85 s — AB (ref 24–37)

## 2013-10-23 LAB — DIGOXIN LEVEL: Digoxin Level: 1.4 ng/mL (ref 0.8–2.0)

## 2013-10-23 LAB — LIPASE, BLOOD: Lipase: 34 U/L (ref 11–59)

## 2013-10-23 MED ORDER — FERROUS SULFATE 325 (65 FE) MG PO TABS
325.0000 mg | ORAL_TABLET | Freq: Every day | ORAL | Status: DC
  Filled 2013-10-23 (×2): qty 1

## 2013-10-23 MED ORDER — PROMETHAZINE HCL 25 MG/ML IJ SOLN: 12.5000 mg | Freq: Once | INTRAMUSCULAR | Status: DC

## 2013-10-23 MED ORDER — DIGOXIN 125 MCG PO TABS: 0.1250 mg | ORAL_TABLET | Freq: Every day | ORAL | Status: DC

## 2013-10-23 MED ORDER — VITAMINS A & D EX OINT
TOPICAL_OINTMENT | CUTANEOUS | Status: AC
  Administered 2013-10-23: 2
  Filled 2013-10-23: qty 10

## 2013-10-23 NOTE — Progress Notes (Signed)
[facility] PROGRESS NOTE  Sabrina Hurley ZOX:[id number] DOB: 1928-05-06 DOA: 10/27/2013 PCP: Dicie BeamMANGRUM,CHARLITA, MD  Assessment/Plan: ARF (acute renal failure) / Dehydration - cont IV fluid hydration as tolerated, monitor ins and outs, daily weights and electrolytes daily  - Creatinine continues to rise in the setting of active diarrhea - Will cautiously increase IVF from 75cc to 100cc/hr Nausea and Vomiting - PRN anitemetics Atrial fibrillation - rate currently controlled, anticoagulated with Pradaxa, holding digoxin Hematuria - urine culture ordered with no growth noted, empirically started IV ceftriaxone, consider d/c abx given neg culture, no fevers, no leukocytosis Weakness-PT / OT consulted  Iron deficiency Anemia - hemoccult stool x 3, follow CBC, Iron of 25. Will start oral iron supplimentation  Gallstones - noted on abd US without signs of obstruction or Murphy's sign, Surgery consulted Nodule of left lung - discussed with patient, will need outpatient followup imaging with PCP  Elevated bilirubin - stable, ABD US order as above  Elevated digoxin level - mildly elevated in setting of ARF and dehydration, will hydrate now,  cont to monitor, would resume digoxin when levels therapeutic CHF (congestive heart failure) per patient history, monitor I/O, weights, fluids, check echocardiogram, mod to severe MR with normal EF noted History of hypoglycemia - monitor CBG closely  High cholesterol - check fasting lipid panel  GERD (gastroesophageal reflux disease) - protonix daily for GI prophylaxis  Hypertension - resume home medications  Type 2 diabetes mellitus - close monitoring of CBG as above, supplemental insulin as needed for elevated BS readings Diarrhea - unclear etiology - doubt cdiff given lack of fever, abd tenderness, and no leukocytosis - supportive care for now with IVF as tolerated - Appears to be improving  Code Status: Full Family Communication: Pt in room  (indicate person spoken with, relationship, and if by phone, the number) Disposition Plan: Pending  Consultants:  Surgery  Procedures:  none  Antibiotics:  none   HPI/Subjective: No acute events noted overnight. Still having loose stools  Objective: Filed Vitals:   10/22/13 1438 10/22/13 2042 10/23/13 0531 10/23/13 1344  BP: 127/55 108/58 120/60 92/43  Pulse: 60 65 56 67  Temp: 98.2 F (36.8 C) 98.1 F (36.7 C) 97.8 F (36.6 C) 97.1 F (36.2 C)  TempSrc: Oral Oral Oral Oral  Resp: 18 18 18 20   Height:      Weight:   68.9 kg (151 lb 14.4 oz)   SpO2: 96% 99% 98% 95%    Intake/Output Summary (Last 24 hours) at 10/23/13 1707 Last data filed at 10/23/13 0559  Gross per 24 hour  Intake 2497.5 ml  Output      0 ml  Net 2497.5 ml   Filed Weights   11/01/2013 1345 10/16/2013 1552 10/23/13 0531  Weight: 66.6 kg (146 lb 13.2 oz) 66.6 kg (146 lb 13.2 oz) 68.9 kg (151 lb 14.4 oz)    Exam:   General:  Awake, in nad  Cardiovascular: regular, s1, s2  Respiratory: normal resp effort, no wheezing  Abdomen: soft, nondistended  Musculoskeletal: perfused, no clubbing   Data Reviewed: Basic Metabolic Panel:  Recent Labs Lab 11/10/2013 1035 10/22/13 0536 10/23/13 0811  NA 138 138 139  K 4.5 4.9 4.7  CL 102 107 107  CO2 18* 19 16*  GLUCOSE 146* 101* 103*  BUN 22 25* 29*  CREATININE 1.50* 1.75* 1.90*  CALCIUM 9.1 8.3* 8.5   Liver Function Tests:  Recent Labs Lab 10/17/2013 1035 10/22/13 0536  AST 34 29  ALT 19  17  ALKPHOS 162* 137*  BILITOT 1.5* 1.6*  PROT 7.8 6.8  ALBUMIN 3.5 2.9*    Recent Labs Lab 10/30/2013 1035  LIPASE 47   No results found for this basename: AMMONIA,  in the last 168 hours CBC:  Recent Labs Lab 11/02/2013 1035 10/22/13 0536 10/23/13 0811  WBC 6.5 6.5 6.7  NEUTROABS 3.9  --   --   HGB 8.6* 8.0* 8.2*  HCT 26.7* 23.4* 24.6*  MCV 98.2 95.1 96.5  PLT 204 195 207   Cardiac Enzymes:  Recent Labs Lab 11/02/2013 1905  10/16/2013 2328 10/22/13 0536  TROPONINI <0.30 <0.30 <0.30   BNP (last 3 results)  Recent Labs  11/10/2013 1905  PROBNP 4933.0*   CBG:  Recent Labs Lab 10/22/13 0818 10/22/13 1136 10/22/13 1633 10/22/13 2128 10/23/13 0738  GLUCAP 95 104* 99 123* 102*    Recent Results (from the past 240 hour(s))  URINE CULTURE     Status: None   Collection Time    11/02/2013 10:09 AM      Result Value Range Status   Specimen Description URINE, CLEAN CATCH   Final   Special Requests NONE   Final   Culture  Setup Time     Final   Value: 10/19/2013 21:28     Performed at [facility] Count     Final   Value: NO GROWTH     Performed at [facility]   Culture     Final   Value: NO GROWTH     Performed at [facility]   Report Status 10/22/2013 FINAL   Final     Studies: US Abdomen Complete  10/22/2013   CLINICAL DATA:  Nausea, vomiting, hematuria and renal failure.  EXAM: ULTRASOUND ABDOMEN COMPLETE  COMPARISON:  None.  FINDINGS: Gallbladder:  Gallstones identified in the gallbladder. The gallbladder wall is mildly prominent in thickness, measuring 4 mm. No sonographic Eulah Pont sign was elicited. There is no evidence of pericholecystic fluid.  Common bile duct:  Diameter: Normal caliber of 4 mm.  Liver:  No focal lesion identified. Within normal limits in parenchymal echogenicity.  IVC:  The IVC and hepatic veins are diffusely dilated. This may be consistent with underlying right heart failure.  Pancreas:  Visualized portion unremarkable.  Spleen:  Size and appearance within normal limits.  Right Kidney:  Length: 10 cm. The renal cortex is mildly echogenic and mildly atrophic. Findings are likely consistent with chronic kidney disease. A simple cyst measures 3 cm. No hydronephrosis identified.  Left Kidney:  Length: 10.2 cm. The left renal cortex is also mildly echogenic. Multiple cysts are present with the largest measuring 3.8 cm. All cysts have a benign appearance.  No hydronephrosis identified.  Abdominal aorta:  No aneurysm visualized.  Other findings:  A small amount of ascites is seen adjacent to the liver.  IMPRESSION: 1. Cholelithiasis with some prominence of gallbladder wall thickness. No sonographic Eulah Pont sign was elicited. There may be a component of chronic inflammation. No evidence of biliary obstruction. 2. Diffuse dilatation of the IVC and hepatic vein suggestive of underlying right heart failure. 3. Mildly echogenic kidneys, likely reflecting chronic kidney disease. No evidence of hydronephrosis. 4. Small amount of ascites.   Electronically Signed   By: Irish Lack M.D.   On: 10/22/2013 12:34    Scheduled Meds: . amLODipine  10 mg Oral Daily  . atorvastatin  20 mg Oral Daily  . cefTRIAXone (ROCEPHIN)  IV  1 g Intravenous Q24H  . dabigatran  75 mg Oral BID  . docusate sodium  100 mg Oral BID  . [START ON 2013-11-01] ferrous sulfate  325 mg Oral Q breakfast  . insulin aspart  0-9 Units Subcutaneous TID WC  . metoprolol  50 mg Oral BID  . montelukast  10 mg Oral QHS  . pantoprazole  40 mg Oral Daily  . sodium chloride  3 mL Intravenous Q12H   Continuous Infusions: . sodium chloride 100 mL/hr at 10/23/13 1002    Principal Problem:   ARF (acute renal failure) Active Problems:   Vomiting   Atrial fibrillation   Dehydration   Weakness   Anemia   Gallstones   Nodule of left lung   Elevated bilirubin   Elevated digoxin level   CHF (congestive heart failure)   History of hypoglycemia   High cholesterol   GERD (gastroesophageal reflux disease)   Hypertension   Type 2 diabetes mellitus   Hematuria   UTI (urinary tract infection)   CAD (coronary atherosclerotic disease)   S/P CABG (coronary artery bypass graft)  Time spent:  Adriona Kaney K  [facility] Pager [phone]. If 7PM-7AM, please contact night-coverage at [email or web address], password [facility] 10/23/2013, 5:07 PM  LOS: 2 days

## 2013-10-23 NOTE — Progress Notes (Signed)
Physical Therapy Treatment Patient Details Name: Sabrina Hurley MRN: [id number] DOB: May 23, 1928 Today's Date: 10/23/2013 Time: 4098-1191 PT Time Calculation (min): 25 min  PT Assessment / Plan / Recommendation  History of Present Illness 80 y.o. female from [facility] with type 2 diabetes mellitus, congestive heart failure, hypertension and chronic anemia presented to the med [employer] today with family because they became concerned that the patient has been nauseated with poor oral intake for the past several days.  The patient had recently been hospitalized in [facility] with hypoglycemia and dehydration.  She is recently been staying with her daughter who became concerned because of her confusion and malaise.  For the past week she has been having episodes of intermittent confusion and complaints of nausea.    PT Comments   Pt OOB in recliner feeling better.  Amb in hallway without holding to IV pole.  Noted slight unsteady/drunken gait with occasional LOB (self corrected).  Performed BERG balance test.  Pt scored 43/56 indicating need for an AD.  Advised pt would be beneficial esp for out of the house mobility.  Pt agreed.    Follow Up Recommendations  Home health PT     Does the patient have the potential to tolerate intense rehabilitation     Barriers to Discharge        Equipment Recommendations  Rolling walker with 5" wheels (after BERG balance results advised pt RW would be benefical esp for out of the house use-pt agreed)    Recommendations for Other Services    Frequency Min 3X/week   Progress towards PT Goals Progress towards PT goals: Progressing toward goals  Plan      Precautions / Restrictions Precautions Precautions: Fall Restrictions Weight Bearing Restrictions: No    Pertinent Vitals/Pain No c/o pain    Mobility  Bed Mobility General bed mobility comments: NT  Pt OOB in recliner  Transfers Overall transfer level: Needs  assistance Equipment used: None Transfers: Sit to/from Stand Sit to Stand: Min guard General transfer comment: 25% VC's on hand placement to increase staedyness/safety Ambulation/Gait Ambulation/Gait assistance: Min guard;Min assist Ambulation Distance (Feet): 120 Feet Assistive device: None Gait Pattern/deviations: Decreased step length - right;Decreased step length - left;Drifts right/left Gait velocity: decreased General Gait Details: Had pt amb without holding to IV pole.  Noted to have a Min unsteady inconsistant gait with occassional LOB (self corrected) slightly drunken.  Pt admits she steadys self with furniture in the house.      PT Goals (current goals can now be found in the care plan section)    Visit Information  Last PT Received On: 10/23/13 Assistance Needed: +1 History of Present Illness: 80 y.o. female from [facility] with type 2 diabetes mellitus, congestive heart failure, hypertension and chronic anemia presented to the med [employer] today with family because they became concerned that the patient has been nauseated with poor oral intake for the past several days.  The patient had recently been hospitalized in [facility] with hypoglycemia and dehydration.  She is recently been staying with her daughter who became concerned because of her confusion and malaise.  For the past week she has been having episodes of intermittent confusion and complaints of nausea.     Subjective Data      Cognition       Balance  Balance Overall balance assessment: Needs assistance Standardized Balance Assessment Standardized Balance Assessment : Berg Balance Test Berg Balance Test Sit to Stand: Able to  stand without using hands and stabilize independently Standing Unsupported: Able to stand safely 2 minutes Sitting with Back Unsupported but Feet Supported on Floor or Stool: Able to sit safely and securely 2 minutes Stand to Sit: Sits safely with minimal use of  hands Transfers: Able to transfer safely, minor use of hands Standing Unsupported with Eyes Closed: Able to stand 10 seconds safely Standing Ubsupported with Feet Together: Able to place feet together independently and stand for 1 minute with supervision From Standing, Reach Forward with Outstretched Arm: Can reach forward >12 cm safely (5") From Standing Position, Pick up Object from Floor: Able to pick up shoe safely and easily From Standing Position, Turn to Look Behind Over each Shoulder: Looks behind from both sides and weight shifts well Turn 360 Degrees: Able to turn 360 degrees safely one side only in 4 seconds or less Standing Unsupported, Alternately Place Feet on Step/Stool: Needs assistance to keep from falling or unable to try Standing Unsupported, One Foot in Front: Able to take small step independently and hold 30 seconds Standing on One Leg: Unable to try or needs assist to prevent fall Total Score: 43   End of Session PT - End of Session Equipment Utilized During Treatment: Gait belt Activity Tolerance: Patient tolerated treatment well Patient left: in chair;with call bell/phone within reach   Sabrina Hurley  PTA [facility]  Acute  Rehab Pager      [phone]

## 2013-10-23 NOTE — Care Management Note (Signed)
    Page 1 of 1   10/23/2013     1:19:36 PM   CARE MANAGEMENT NOTE 10/23/2013  Patient:  Abigail ButtsFRIERSON,[place]ona   Account Number:  [id number]  Date Initiated:  10/23/2013  Documentation initiated by:  Lanier ClamMAHABIR,Tristyn Pharris  Subjective/Objective Assessment:   80 Y/O F ADMITTED W/ACUTE RENAL FAILURE.     Action/Plan:   FROM HOME.WILL STAY W/DTR IN [place].NO PCP.   Anticipated DC Date:  11/07/2013   Anticipated DC Plan:  HOME W HOME HEALTH SERVICES  In-house referral  PCP / [facility]      DC Planning Services  CM consult      Choice offered to / List presented to:  C-1 Patient           Status of service:  In process, will continue to follow Medicare Important Message given?   (If response is "NO", the following Medicare IM given date fields will be blank) Date Medicare IM given:   Date Additional Medicare IM given:    Discharge Disposition:    Per UR Regulation:  Reviewed for med. necessity/level of care/duration of stay  If discussed at Long Length of Stay Meetings, dates discussed:    Comments:  10/23/13 Jalicia Roszak RN,BSN NCM [phone] PT-HH.[facility] CHOSEN.TC [facility] REP KRISTEN AWARE OF REFERRAL & FOLLOWING FOR HHPT ORDER,& D/C.PATIENT WILL STAY W/DTR @ D/C:[facility] [address],[place] [address] C[phone] [id number].PROVIDED DTR W/PCP LISTING,[facility] WILL CALL TO SET UP PCP APPT.INFORMED HER THAT FOR HHC, MUST HAVE A PCP.DTR VOICED UNDERSTANDING.

## 2013-10-23 NOTE — Consult Note (Signed)
Patient seen chart reviewed.  Agree with WJ's note.  Will see what HIDA shows.

## 2013-10-23 NOTE — Consult Note (Signed)
Reason for Consult:  Cholelithiasis, nausea and vomiting Referring Physician: Dr. Marylu Lund  CC: nausea and not eating, DOE, ORTHOPNEA Sabrina Hurley is an 80 y.o. female.  HPI: This is a single 22 y/p female who lives alone and near [place].  She has had some issues with nausea for sometime.  She can't tell me how long, but before new years.  She had some kind of syncopal episode and was taken to the ER at [facility] on 10/12/13.  She was found to have a low glucose and per the daughter they sent her home and did nothing else.  She remained confused at home over the next 4 day, she was alone again and was taken back to the ER, where nothing much was found.  She was sent home again per her daughter.  The family took her to her PCP in [place] and he stopped her diabetic medicines.  Labs showed she was dehydrated and her renal function was diminished.  She went home with her daughter here in [place] after that, and her daughter says she just can't or won't eat.  She will eat something for breakfast and then take her medicines.  She develops nausea and sometimes she vomits after that.  Then she won't eat for the remainder of the day.  Her daughter says a hotdog is a big lunch for her.  With ongoing nausea and not eating her daughter brought her to the ER here at [facility].  On admit 10/14/2013 her WBC is normal, her creatinine is 1.75, and has risen to 1.9.  Her glucose is normal to low off medicines.  Trop <.30.  BNP 4933, she has anemia with H/H 8/23.4 on admit, 8.2 hemoglobin today, WBC is normal on both 6.5 and 6.7.  Dig level 2.1 on admit, normal TSH. Alk phos is up some, 137, T Bil 1.6, AST/ALT and lipase are normal.   Possible UTI with culture pending. 2 view abd shows gallstone, no other abnormalities, CXR shows no acute changes. Abdominal US shows Cholelithaisis some GB wall thickening, no biliary obstruction, dilated IVC AND Hepatic vein.  Chronic kidney disease., no hydronephrosis.  We are ask to see for  possible cholecystitis with cholelithiasis.  Past Medical History  Diagnosis Date  . Diabetes mellitus without complication   . Hypertension   . GERD (gastroesophageal reflux disease)   . Atrial fibrillation on Pradaxa, anticoagulation since CABG 10 plus years ago in [place].   . High cholesterol   . History of hypoglycemia   . CHF (congestive heart failure)   . Cholelithiasis     Past Surgical History  Procedure Laterality Date  . Coronary artery bypass graft She cannot remember when. Done in [place].  10 or more years ago.    Family History  Problem Relation Age of Onset  . Hypertension      Social History:  reports that she has never smoked. She does not have any smokeless tobacco history on file. Her alcohol and drug histories are not on file. Drugs: none Tobacco about 2 years, quit in her early 20's Etoh:  Rare social drink.  Allergies: No Known Allergies  Medications:  Prior to Admission:  Prescriptions prior to admission  Medication Sig Dispense Refill  . acetaminophen (TYLENOL) 500 MG tablet Take 1,000 mg by mouth every 6 (six) hours as needed.      Marland Kitchen amLODipine (NORVASC) 10 MG tablet Take 10 mg by mouth daily.      Marland Kitchen atorvastatin (LIPITOR) 20 MG tablet  Take 20 mg by mouth daily.      . dabigatran (PRADAXA) 75 MG CAPS capsule Take 75 mg by mouth 2 (two) times daily.      . digoxin (LANOXIN) 0.125 MG tablet Take by mouth daily.      Marland Kitchen glimepiride (AMARYL) 2 MG tablet Take 1 tablet by mouth daily.      Marland Kitchen JANUVIA 100 MG tablet Take 1 tablet by mouth daily.      . metoprolol (LOPRESSOR) 50 MG tablet Take 50 mg by mouth 2 (two) times daily.      . montelukast (SINGULAIR) 10 MG tablet Take 10 mg by mouth at bedtime.      Marland Kitchen DEXILANT 30 MG capsule Take 1 capsule by mouth daily.       Scheduled: . amLODipine  10 mg Oral Daily  . atorvastatin  20 mg Oral Daily  . cefTRIAXone (ROCEPHIN)  IV  1 g Intravenous Q24H  . dabigatran  75 mg Oral BID  . docusate sodium   100 mg Oral BID  . [START ON 11/05/13] ferrous sulfate  325 mg Oral Q breakfast  . insulin aspart  0-9 Units Subcutaneous TID WC  . metoprolol  50 mg Oral BID  . montelukast  10 mg Oral QHS  . pantoprazole  40 mg Oral Daily  . sodium chloride  3 mL Intravenous Q12H   Continuous: . sodium chloride 100 mL/hr at 10/23/13 1002   LSL:[id number], acetaminophen, bisacodyl, ondansetron (ZOFRAN) IV, ondansetron Anti-infectives   Start     Dose/Rate Route Frequency Ordered Stop   10/25/2013 1800  cefTRIAXone (ROCEPHIN) 1 g in dextrose 5 % 50 mL IVPB     1 g 100 mL/hr over 30 Minutes Intravenous Every 24 hours 10/29/2013 1731        Results for orders placed during the hospital encounter of 10/25/2013 (from the past 48 hour(s))  TROPONIN I     Status: None   Collection Time    10/25/2013  7:05 PM      Result Value Range   Troponin I <0.30  <0.30 ng/mL   Comment:            Due to the release kinetics of cTnI,     a negative result within the first hours     of the onset of symptoms does not rule out     myocardial infarction with certainty.     If myocardial infarction is still suspected,     repeat the test at appropriate intervals.  TSH     Status: None   Collection Time    10/20/2013  7:05 PM      Result Value Range   TSH 2.559  0.350 - 4.500 uIU/mL   Comment: Performed at [facility]     Status: Abnormal   Collection Time    10/18/2013  7:05 PM      Result Value Range   Pro B Natriuretic peptide (BNP) 4933.0 (*) 0 - 450 pg/mL  HEMOGLOBIN A1C     Status: Abnormal   Collection Time    10/25/2013  7:05 PM      Result Value Range   Hemoglobin A1C 5.9 (*) <5.7 %   Comment: (NOTE)  According to the ADA Clinical Practice Recommendations for 2011, when     HbA1c is used as a screening test:      >=6.5%   Diagnostic of Diabetes Mellitus               (if abnormal result is  confirmed)     5.7-6.4%   Increased risk of developing Diabetes Mellitus     References:Diagnosis and Classification of Diabetes Mellitus,Diabetes     WUGQ,9169,45(WTUUE 1):S62-S69 and Standards of Medical Care in             Diabetes - 2011,Diabetes [employer],3491,79 (Suppl 1):S11-S61.   Mean Plasma Glucose 123 (*) <117 mg/dL   Comment: Performed at [facility]     Status: Abnormal   Collection Time    10/20/2013  7:05 PM      Result Value Range   Vitamin B-12 >2000 (*) 211 - 911 pg/mL   Comment: Performed at [facility]     Status: None   Collection Time    10/20/2013  7:05 PM      Result Value Range   Folate >20.0     Comment: (NOTE)     Reference Ranges            Deficient:       0.4 - 3.3 ng/mL            Indeterminate:   3.4 - 5.4 ng/mL            Normal:              > 5.4 ng/mL     Performed at [facility]     Status: Abnormal   Collection Time    11/07/2013  7:05 PM      Result Value Range   Iron 25 (*) 42 - 135 ug/dL   TIBC 304  250 - 470 ug/dL   Saturation Ratios 8 (*) 20 - 55 %   UIBC 279  125 - 400 ug/dL   Comment: Performed at [facility]     Status: None   Collection Time    10/18/2013  7:05 PM      Result Value Range   Ferritin 35  10 - 291 ng/mL   Comment: Performed at [facility]     Status: Abnormal   Collection Time    11/01/2013  7:05 PM      Result Value Range   Retic Ct Pct 4.5 (*) 0.4 - 3.1 %   RBC. 2.48 (*) 3.87 - 5.11 MIL/uL   Retic Count, Manual 111.6  19.0 - 186.0 K/uL  GLUCOSE, CAPILLARY     Status: Abnormal   Collection Time    10/14/2013  9:27 PM      Result Value Range   Glucose-Capillary 111 (*) 70 - 99 mg/dL  TROPONIN I     Status: None   Collection Time    10/27/2013 11:28 PM      Result Value Range   Troponin I <0.30  <0.30 ng/mL   Comment:            Due to the release kinetics of cTnI,     a negative result within the first hours      of the onset of symptoms does not rule out     myocardial infarction with certainty.     If myocardial infarction is still  suspected,     repeat the test at appropriate intervals.  TROPONIN I     Status: None   Collection Time    10/22/13  5:36 AM      Result Value Range   Troponin I <0.30  <0.30 ng/mL   Comment:            Due to the release kinetics of cTnI,     a negative result within the first hours     of the onset of symptoms does not rule out     myocardial infarction with certainty.     If myocardial infarction is still suspected,     repeat the test at appropriate intervals.  CBC     Status: Abnormal   Collection Time    10/22/13  5:36 AM      Result Value Range   WBC 6.5  4.0 - 10.5 K/uL   RBC 2.46 (*) 3.87 - 5.11 MIL/uL   Hemoglobin 8.0 (*) 12.0 - 15.0 g/dL   HCT 23.4 (*) 36.0 - 46.0 %   MCV 95.1  78.0 - 100.0 fL   MCH 32.5  26.0 - 34.0 pg   MCHC 34.2  30.0 - 36.0 g/dL   RDW 17.8 (*) 11.5 - 15.5 %   Platelets 195  150 - 400 K/uL  COMPREHENSIVE METABOLIC PANEL     Status: Abnormal   Collection Time    10/22/13  5:36 AM      Result Value Range   Sodium 138  137 - 147 mEq/L   Potassium 4.9  3.7 - 5.3 mEq/L   Chloride 107  96 - 112 mEq/L   CO2 19  19 - 32 mEq/L   Glucose, Bld 101 (*) 70 - 99 mg/dL   BUN 25 (*) 6 - 23 mg/dL   Creatinine, Ser 1.75 (*) 0.50 - 1.10 mg/dL   Calcium 8.3 (*) 8.4 - 10.5 mg/dL   Total Protein 6.8  6.0 - 8.3 g/dL   Albumin 2.9 (*) 3.5 - 5.2 g/dL   AST 29  0 - 37 U/L   ALT 17  0 - 35 U/L   Alkaline Phosphatase 137 (*) 39 - 117 U/L   Total Bilirubin 1.6 (*) 0.3 - 1.2 mg/dL   GFR calc non Af Amer 26 (*) >90 mL/min   GFR calc Af Amer 30 (*) >90 mL/min   Comment: (NOTE)     The eGFR has been calculated using the CKD EPI equation.     This calculation has not been validated in all clinical situations.     eGFR's persistently <90 mL/min signify possible Chronic Kidney     Disease.  DIGOXIN LEVEL     Status: Abnormal   Collection Time     10/22/13  5:36 AM      Result Value Range   Digoxin Level 2.1 (*) 0.8 - 2.0 ng/mL  GLUCOSE, CAPILLARY     Status: None   Collection Time    10/22/13  8:18 AM      Result Value Range   Glucose-Capillary 95  70 - 99 mg/dL  GLUCOSE, CAPILLARY     Status: Abnormal   Collection Time    10/22/13 11:36 AM      Result Value Range   Glucose-Capillary 104 (*) 70 - 99 mg/dL  GLUCOSE, CAPILLARY     Status: None   Collection Time    10/22/13  4:33 PM      Result Value Range  Glucose-Capillary 99  70 - 99 mg/dL  GLUCOSE, CAPILLARY     Status: Abnormal   Collection Time    10/22/13  9:28 PM      Result Value Range   Glucose-Capillary 123 (*) 70 - 99 mg/dL  GLUCOSE, CAPILLARY     Status: Abnormal   Collection Time    10/23/13  7:38 AM      Result Value Range   Glucose-Capillary 102 (*) 70 - 99 mg/dL   Comment 1 Notify RN     Comment 2 Documented in Chart    CBC     Status: Abnormal   Collection Time    10/23/13  8:11 AM      Result Value Range   WBC 6.7  4.0 - 10.5 K/uL   RBC 2.55 (*) 3.87 - 5.11 MIL/uL   Hemoglobin 8.2 (*) 12.0 - 15.0 g/dL   HCT 24.6 (*) 36.0 - 46.0 %   MCV 96.5  78.0 - 100.0 fL   MCH 32.2  26.0 - 34.0 pg   MCHC 33.3  30.0 - 36.0 g/dL   RDW 18.1 (*) 11.5 - 15.5 %   Platelets 207  150 - 400 K/uL  BASIC METABOLIC PANEL     Status: Abnormal   Collection Time    10/23/13  8:11 AM      Result Value Range   Sodium 139  137 - 147 mEq/L   Potassium 4.7  3.7 - 5.3 mEq/L   Chloride 107  96 - 112 mEq/L   CO2 16 (*) 19 - 32 mEq/L   Glucose, Bld 103 (*) 70 - 99 mg/dL   BUN 29 (*) 6 - 23 mg/dL   Creatinine, Ser 1.90 (*) 0.50 - 1.10 mg/dL   Calcium 8.5  8.4 - 10.5 mg/dL   GFR calc non Af Amer 23 (*) >90 mL/min   GFR calc Af Amer 27 (*) >90 mL/min   Comment: (NOTE)     The eGFR has been calculated using the CKD EPI equation.     This calculation has not been validated in all clinical situations.     eGFR's persistently <90 mL/min signify possible Chronic Kidney      Disease.  DIGOXIN LEVEL     Status: None   Collection Time    10/23/13 11:47 AM      Result Value Range   Digoxin Level 1.4  0.8 - 2.0 ng/mL   Comment: Performed at [facility]    US Abdomen Complete  10/22/2013   CLINICAL DATA:  Nausea, vomiting, hematuria and renal failure.  EXAM: ULTRASOUND ABDOMEN COMPLETE  COMPARISON:  None.  FINDINGS: Gallbladder:  Gallstones identified in the gallbladder. The gallbladder wall is mildly prominent in thickness, measuring 4 mm. No sonographic Percell Miller sign was elicited. There is no evidence of pericholecystic fluid.  Common bile duct:  Diameter: Normal caliber of 4 mm.  Liver:  No focal lesion identified. Within normal limits in parenchymal echogenicity.  IVC:  The IVC and hepatic veins are diffusely dilated. This may be consistent with underlying right heart failure.  Pancreas:  Visualized portion unremarkable.  Spleen:  Size and appearance within normal limits.  Right Kidney:  Length: 10 cm. The renal cortex is mildly echogenic and mildly atrophic. Findings are likely consistent with chronic kidney disease. A simple cyst measures 3 cm. No hydronephrosis identified.  Left Kidney:  Length: 10.2 cm. The left renal cortex is also mildly echogenic. Multiple cysts are present with the largest  measuring 3.8 cm. All cysts have a benign appearance. No hydronephrosis identified.  Abdominal aorta:  No aneurysm visualized.  Other findings:  A small amount of ascites is seen adjacent to the liver.  IMPRESSION: 1. Cholelithiasis with some prominence of gallbladder wall thickness. No sonographic Percell Miller sign was elicited. There may be a component of chronic inflammation. No evidence of biliary obstruction. 2. Diffuse dilatation of the IVC and hepatic vein suggestive of underlying right heart failure. 3. Mildly echogenic kidneys, likely reflecting chronic kidney disease. No evidence of hydronephrosis. 4. Small amount of ascites.   Electronically Signed   By: Aletta Edouard  M.D.   On: 10/22/2013 12:34    Review of Systems  Constitutional: Positive for weight loss (they are not sure). Negative for fever, chills, malaise/fatigue and diaphoresis.  HENT: Negative.   Eyes: Negative.   Respiratory: Positive for cough (DRY), shortness of breath (DOE) and wheezing.   Cardiovascular: Positive for orthopnea, leg swelling and PND. Negative for chest pain, palpitations and claudication.  Gastrointestinal: Positive for heartburn and vomiting (OCCASIONALLY, THIS IS NEW). Negative for abdominal pain (NO PAIN BEFORE ADMIT OR NOW), constipation, blood in stool and melena. Nausea: ON AND OFF STARTED BEFORE 10/12/13, SHE'S NOT SURE HOW LONG. Diarrhea: SOME SINCE ADMIT AND ANTIBIOTICS.  Genitourinary: Negative.   Musculoskeletal: Positive for back pain (SOME), falls and joint pain (SOME).  Skin: Negative.   Neurological: Positive for loss of consciousness (TAKEN TO ER at local hospital on 10/12/13, with LOC AND found to have low glucose.  no current records, she was sent home that evening.). Negative for focal weakness, seizures and weakness.       FAMILY SAYS SHE HAS BEEN CONFUSED.  THEY STOPPED ALL HER DIABETIC MEDICINES LAST WEEK.  Endo/Heme/Allergies: Bruises/bleeds easily: SHE IS ON PRADAXA.  Psychiatric/Behavioral: Positive for memory loss. Negative for depression and hallucinations. The patient is not nervous/anxious and does not have insomnia.    Blood pressure 92/43, pulse 67, temperature 97.1 F (36.2 C), temperature source Oral, resp. rate 20, height 5' 4"  (1.626 m), weight 68.9 kg (151 lb 14.4 oz), SpO2 95.00%. Physical Exam  Constitutional: She is oriented to person, place, and time. No distress.  Elderly female in no distress, tray of clears in front of her.  HENT:  Head: Normocephalic and atraumatic.  Nose: Nose normal.  Eyes: Conjunctivae and EOM are normal. Pupils are equal, round, and reactive to light. Right eye exhibits no discharge. Left eye exhibits no  discharge. No scleral icterus.  Neck: Normal range of motion. Neck supple. No JVD present. No tracheal deviation present. No thyromegaly present.  Cardiovascular: Normal rate, regular rhythm and normal heart sounds.  Exam reveals no gallop.   No murmur heard. I had a hard time feeling distal pulses  Respiratory: Effort normal and breath sounds normal. No respiratory distress. She has no wheezes. She has no rales. She exhibits no tenderness.  bs down in both bases  GI: Soft. Bowel sounds are normal. She exhibits no distension and no mass. There is no tenderness. There is no rebound and no guarding.  Musculoskeletal: Normal range of motion. She exhibits edema (Both uppper and lower legs swollen). She exhibits no tenderness.  Lymphadenopathy:    She has no cervical adenopathy.  Neurological: She is alert and oriented to person, place, and time. No cranial nerve deficit.  Skin: Skin is warm and dry. No rash noted. She is not diaphoretic. No erythema. No pallor.  Psychiatric: She has a normal mood and  affect. Her behavior is normal. Judgment and thought content normal.    Assessment/Plan: 1.  Nausea, vomiting, dehydration of uncertain etiology. 2.  Cholelithiasis 3.  AODM with recent hypoglycemia and decreased PO intake/all home diabetic medicines discontinued.   4.  Dehydration and progressive renal insuffiencey 5.  Hx of CAD/S/p CABG 10 PLUS years ago. 6.  Atrial fibrillation with chronic anticoagulation (10 plus years) 7.  Hx of CHF (ECHO here 10/22/13 shows normal EF 55-60%, but some diastolic dysfunction Mild MR, severe  RA overload, wide open TR) 8.  Hypertension 9.  GERD 10.  Hx of dyslipidemia 11.  Anemia  Plan:  I doubt this nausea is coming from her gallbladder, or cholelithiasis.  She has no pain,  No WBC, she does have some nausea after eating.  I think her;  cardiac issues, cyclic not eating, hypoglycemia, renal dysfunction, and on going anemia with Afib are the prime suspects for  her episodes of recurrent nausea.  She also has anemia in the face of chronic anticoagulation. I would stop her Pradaxa and cover her with heparin now. Her daughter is not sure how much of her AM meds she retains.  Repeat labs and get a HIDA tomorrow.  Continue to treat her renal insuffiencey.  Then see what we have after the HIDA.  Last dose of Pradaxa was 10:30 this morning. Creatinine clearance is 22.5.     Rodrecus Belsky 10/23/2013, 5:27 PM

## 2013-10-23 NOTE — Evaluation (Signed)
Occupational Therapy Evaluation Patient Details Name: Sabrina Hurley MRN: [id number] DOB: 1928-01-18 Today's Date: 10/23/2013 Time: 0900-0920 OT Time Calculation (min): 20 min  OT Assessment / Plan / Recommendation History of present illness 80 y.o. female from [facility] with type 2 diabetes mellitus, congestive heart failure, hypertension and chronic anemia presented to the med [employer] today with family because they became concerned that the patient has been nauseated with poor oral intake for the past several days.  The patient had recently been hospitalized in [facility] with hypoglycemia and dehydration.  She is recently been staying with her daughter who became concerned because of her confusion and malaise.  For the past week she has been having episodes of intermittent confusion and complaints of nausea.    Clinical Impression   Pt overall at min guard to min assist level with ADL. She plans to d/c to daughters house. Will benefit from skilled OT services to maximize ADL independence for d/c to family house.   OT Assessment  Patient needs continued OT Services    Follow Up Recommendations  Home health OT;Supervision/Assistance - 24 hour    Barriers to Discharge      Equipment Recommendations  3 in 1 bedside comode;Tub/shower bench (tub transfer bench)    Recommendations for Other Services    Frequency  Min 2X/week    Precautions / Restrictions Precautions Precautions: Fall Restrictions Weight Bearing Restrictions: No   Pertinent Vitals/Pain No complaint of    ADL  Grooming: Performed;Wash/dry hands;Min guard Where Assessed - Grooming: Unsupported standing Upper Body Bathing: Simulated;Chest;Right arm;Left arm;Abdomen;Set up Where Assessed - Upper Body Bathing: Unsupported sitting Lower Body Bathing: Simulated;Minimal assistance Where Assessed - Lower Body Bathing: Supported sit to stand Upper Body Dressing: Simulated;Set up Where Assessed -  Upper Body Dressing: Unsupported sitting Lower Body Dressing: Simulated;Minimal assistance Where Assessed - Lower Body Dressing: Supported sit to stand Toilet Transfer: Performed;Minimal assistance Acupuncturist: Comfort height toilet Toileting - Clothing Manipulation and Hygiene: Simulated;Minimal assistance Where Assessed - Engineer, mining and Hygiene: Sit to stand from 3-in-1 or toilet ADL Comments: Pt initially with difficulty standing from EOB and needed several attempts and min assist to balance. She used IV pole to transfer into bathroom and was needing to reach for edge of toilet to help descend and stand. Discussed at length with daughter and pt safely with tub transfers and option of tub transfer bench as the daughter has been helping pt in and out of tub (pt has been at daughters house about a week due to weakness). Pt and daughter would like to obtain a tubbench and 3in1.     OT Diagnosis: Generalized weakness  OT Problem List: Decreased strength;Decreased knowledge of use of DME or AE OT Treatment Interventions: Self-care/ADL training;DME and/or AE instruction;Therapeutic activities;Patient/family education   OT Goals(Current goals can be found in the care plan section) Acute Rehab OT Goals Patient Stated Goal: To get stronger.  Daughter would like pt to go home with her. OT Goal Formulation: With patient/family Time For Goal Achievement: 11/06/13 Potential to Achieve Goals: Good  Visit Information  Last OT Received On: 10/23/13 Assistance Needed: +1 History of Present Illness: 80 y.o. female from [facility] with type 2 diabetes mellitus, congestive heart failure, hypertension and chronic anemia presented to the med [employer] today with family because they became concerned that the patient has been nauseated with poor oral intake for the past several days.  [place]  [facility] [place].  She is recently been staying with her daughter who became concerned because of her confusion and malaise.  For the past week she has been having episodes of intermittent confusion and complaints of nausea.        Prior Functioning     Home Living Family/patient expects to be discharged to:: Private residence Living Arrangements: Alone Available Help at Discharge: Family Type of Home: House Home Access: Stairs to enter Secretary/administratorntrance Stairs-Number of Steps: 4 Entrance Stairs-Rails: Left;Right Home Layout: One level Home Equipment: None Additional Comments: Pt may d/c to daughter's home where she can have 24 hour A, but there is a flight of stairs to the bed room. Prior Function Level of Independence: Independent;Needs assistance ADL's / Homemaking Assistance Needed: pts daughter states she has been helping pt get in and out of tub for safety.  daughter does meals, household tasks. Communication Communication: No difficulties         Vision/Perception     Cognition  Cognition Arousal/Alertness: Awake/alert Behavior During Therapy: WFL for tasks assessed/performed Overall Cognitive Status: Within Functional Limits for tasks assessed    Extremity/Trunk Assessment Upper Extremity Assessment Upper Extremity Assessment: Generalized weakness     Mobility Bed Mobility Overal bed mobility: Needs Assistance Bed Mobility: Supine to Sit Supine to sit: Supervision Transfers Overall transfer level: Needs assistance Transfers: Sit to/from Stand Sit to Stand: Min assist     Exercise     Balance     End of Session OT - End of Session Equipment Utilized During Treatment: Gait belt Activity Tolerance: Patient tolerated treatment well Patient left: in chair;with call bell/phone within reach;with family/visitor present  GO     Lennox LaityStone, Ardella Chhim Stafford [phone] 10/23/2013, 9:29 AM

## 2013-10-23 NOTE — Progress Notes (Signed)
Please note: an addendum was accidentally placed over Progress Note from 1/11. Please click on "revision history" to view the actual note from 1/11.

## 2013-10-24 ENCOUNTER — Inpatient Hospital Stay (HOSPITAL_COMMUNITY): Payer: Medicare Other

## 2013-10-24 DIAGNOSIS — S270XXA Traumatic pneumothorax, initial encounter: Secondary | ICD-10-CM

## 2013-10-24 DIAGNOSIS — I469 Cardiac arrest, cause unspecified: Principal | ICD-10-CM

## 2013-10-24 DIAGNOSIS — J9600 Acute respiratory failure, unspecified whether with hypoxia or hypercapnia: Secondary | ICD-10-CM

## 2013-10-24 LAB — PROTIME-INR
INR: 3.85 — ABNORMAL HIGH (ref 0.00–1.49)
Prothrombin Time: 36.4 seconds — ABNORMAL HIGH (ref 11.6–15.2)

## 2013-10-24 LAB — MAGNESIUM: MAGNESIUM: 2.2 mg/dL (ref 1.5–2.5)

## 2013-10-24 LAB — CBC
HEMATOCRIT: 23 % — AB (ref 36.0–46.0)
HEMOGLOBIN: 7.5 g/dL — AB (ref 12.0–15.0)
MCH: 32.3 pg (ref 26.0–34.0)
MCHC: 32.6 g/dL (ref 30.0–36.0)
MCV: 99.1 fL (ref 78.0–100.0)
Platelets: 189 10*3/uL (ref 150–400)
RBC: 2.32 MIL/uL — AB (ref 3.87–5.11)
RDW: 18.4 % — ABNORMAL HIGH (ref 11.5–15.5)
WBC: 7.5 10*3/uL (ref 4.0–10.5)

## 2013-10-24 LAB — COMPREHENSIVE METABOLIC PANEL
ALK PHOS: 124 U/L — AB (ref 39–117)
ALT: 22 U/L (ref 0–35)
AST: 43 U/L — AB (ref 0–37)
Albumin: 2.6 g/dL — ABNORMAL LOW (ref 3.5–5.2)
BILIRUBIN TOTAL: 1.4 mg/dL — AB (ref 0.3–1.2)
BUN: 34 mg/dL — ABNORMAL HIGH (ref 6–23)
CHLORIDE: 108 meq/L (ref 96–112)
CO2: 10 mEq/L — CL (ref 19–32)
Calcium: 7.6 mg/dL — ABNORMAL LOW (ref 8.4–10.5)
Creatinine, Ser: 2.16 mg/dL — ABNORMAL HIGH (ref 0.50–1.10)
GFR calc Af Amer: 23 mL/min — ABNORMAL LOW (ref >90)
GFR calc non Af Amer: 20 mL/min — ABNORMAL LOW (ref >90)
Glucose, Bld: 72 mg/dL (ref 70–99)
POTASSIUM: 4.7 meq/L (ref 3.7–5.3)
Sodium: 140 mEq/L (ref 137–147)
TOTAL PROTEIN: 6.3 g/dL (ref 6.0–8.3)

## 2013-10-24 LAB — TROPONIN I: Troponin I: 1.13 ng/mL (ref <0.30)

## 2013-10-24 LAB — PHOSPHORUS: Phosphorus: 4.9 mg/dL — ABNORMAL HIGH (ref 2.3–4.6)

## 2013-10-24 LAB — GLUCOSE, CAPILLARY
GLUCOSE-CAPILLARY: 103 mg/dL — AB (ref 70–99)
Glucose-Capillary: 112 mg/dL — ABNORMAL HIGH (ref 70–99)

## 2013-10-24 MED ORDER — MORPHINE SULFATE 10 MG/ML IJ SOLN
10.0000 mg/h | INTRAVENOUS | Status: DC
  Filled 2013-10-24: qty 10

## 2013-10-24 MED ORDER — NOREPINEPHRINE BITARTRATE 1 MG/ML IJ SOLN
2.0000 ug/min | INTRAVENOUS | Status: DC
  Administered 2013-10-24: 34.987 ug/min via INTRAVENOUS
  Filled 2013-10-24: qty 16

## 2013-10-24 MED ORDER — STERILE WATER FOR INJECTION IV SOLN
INTRAVENOUS | Status: DC
  Administered 2013-10-24: 02:00:00 via INTRAVENOUS
  Filled 2013-10-24 (×2): qty 850

## 2013-10-24 MED ORDER — MORPHINE BOLUS VIA INFUSION
5.0000 mg | INTRAVENOUS | Status: DC | PRN
  Filled 2013-10-24: qty 20

## 2013-10-24 MED ORDER — FENTANYL CITRATE 0.05 MG/ML IJ SOLN: 50.0000 ug | Freq: Once | INTRAMUSCULAR | Status: DC

## 2013-10-24 MED ORDER — SODIUM CHLORIDE 0.9 % IJ SOLN: 10.0000 mL | Freq: Two times a day (BID) | INTRAMUSCULAR | Status: DC

## 2013-10-24 MED ORDER — INSULIN ASPART 100 UNIT/ML IV SOLN: 10.0000 [IU] | Freq: Once | INTRAVENOUS | Status: DC

## 2013-10-24 MED ORDER — FENTANYL CITRATE 0.05 MG/ML IJ SOLN
INTRAMUSCULAR | Status: AC
  Filled 2013-10-24: qty 2

## 2013-10-24 MED ORDER — SODIUM CHLORIDE 0.9 % IV SOLN
0.0000 ug/h | INTRAVENOUS | Status: DC
  Administered 2013-10-24: 50 ug/h via INTRAVENOUS
  Filled 2013-10-24: qty 50

## 2013-10-24 MED ORDER — INSULIN ASPART 100 UNIT/ML IV SOLN
10.0000 [IU] | Freq: Once | INTRAVENOUS | Status: AC
  Administered 2013-10-24: 10 [IU] via INTRAVENOUS
  Filled 2013-10-24: qty 0.1

## 2013-10-24 MED ORDER — SODIUM CHLORIDE 0.9 % IJ SOLN: 10.0000 mL | INTRAMUSCULAR | Status: DC | PRN

## 2013-10-24 MED ORDER — FENTANYL BOLUS VIA INFUSION
25.0000 ug | INTRAVENOUS | Status: DC | PRN
  Filled 2013-10-24: qty 50

## 2013-10-24 MED FILL — Medication: Qty: 1 | Status: AC

## 2013-11-12 NOTE — ED Provider Notes (Signed)
10/23/13 2350: Called to pt's hospital room for Code Blue. CPR in progress on arrival, IV epi x1 already given, no central pulses. Anesthesia CRNA preparing to intubate pt. Per pt's RN: she was called to pt's room for c/o nausea, gave pt IV zofran. Shortly afterwards, RN was told pt's HR was "29" by central tele. States she checked on pt and found her unresponsive, apneic and pulseless. Code Blue called and CPR was started. Pt was intubated successfully by Anesthesia staff; bloody secretions noted in ETT. CPR continued with IV epi #2 given. +ROSC.  Pt now biting on ETT, +strong central pulses. Monitor with irregular rhythm, rate 80's, SBP 110's. T/C to PCCM Dr. Delton CoombesByrum, case discussed: requests to transfer pt to ICU now for continuation of care. Pt transferred to ICU with ICU RN and medical floor staff.   Cardiopulmonary Resuscitation (CPR) Procedure Note Directed/Performed by: Laray AngerMCMANUS,Faysal Fenoglio M I personally directed ancillary staff and/or performed CPR in an effort to regain return of spontaneous circulation and to maintain cardiac, neuro and systemic perfusion.     Laray AngerKathleen M Vanice Rappa, DO 11/10/2013 [phone]

## 2013-11-12 NOTE — Progress Notes (Signed)
Called received from centra tele regarding pt's heart rate @29 . Pt found unresponsive with barely a heart beat by RN. Code called.

## 2013-11-12 NOTE — Progress Notes (Signed)
Patient evaluated after decision to change code status.  BP in the 40's with HR in the 50 and dropping.  Will cancel CXR order and additional treatment to allow family to spend time with the patient.  No further escalation of care at this point.  Alyson ReedyWesam G. Yacoub, M.D. [facility]. Pager: [phone]. After hours pager: [phone].

## 2013-11-12 NOTE — Progress Notes (Signed)
At 0530, pt noted to be asystole.  No pulse and no respirations verified by 2 RNs, Jairo BenMichelle Kerria Sapien RN and Research officer, political partyQueeneth RN.  Elink notified.  Daughter called and notified of death.

## 2013-11-12 NOTE — Code Documentation (Signed)
Called by E-Link MD for cardiac arrest / transfer to ICU.  Arrived to room with patient intubated.  CXR completed with ETT verifed in good position, pneumothorax visualized.  Central line inserted emergently for IV access, stat labs drawn / sent for processing.  Patient needle decompressed with 18g needle, rush of air noted. Patient prepped for chest tube placement.  Intermittent CPR required for brady / PEA arrest. Levophed gtt initiated.  Patient empirically treated for hyperkalemia.  See code documentation for rhythm changes & medication administration.     Chart Review:  80 y/o F admitted with N/V, dehydration, renal insufficiency.  Hx of afib on pradaxa, DM, CAD.     Canary BrimBrandi Ollis, NP-C [facility] Pgr: [phone] or [phone]  Patient seen and examined, agree with above note.  I dictated the care and orders written for this patient under my direction.  Alyson ReedyWesam G Yacoub, MD [phone]

## 2013-11-12 NOTE — Progress Notes (Signed)
Spoke with patient's family at length who expressed no desire to continue current treatment for her mother. Patient's daughter said her mother would not want to be "kept alive" like this.  Daughter and husband, with chaplain and myself requested withdraw of care.

## 2013-11-12 NOTE — Progress Notes (Signed)
Pt's daughter notified of patient's condition and transfer to ICU.

## 2013-11-12 NOTE — Progress Notes (Signed)
eLink Physician-Brief Progress Note Patient Name: Sabrina Hurley DOB: 30-Nov-1927 MRN: [id number]  Date of Service  11/11/2013   HPI/Events of Note   Called by Dr Verne SpurrMacmanus re code at [phone]. [facility], epi given x 2. Etiology not clear to me at this time, reported to have experienced brady arrest with quick initiation of CPR.   eICU Interventions  - will place vent, sedation stabilization orders now - stat CXR, ABG, labs - will have her evaluated by PCCM now    Intervention Category Major Interventions: Code management / supervision  Sabrina Hurley S. 10/31/2013, 12:13 AM

## 2013-11-12 NOTE — Procedures (Signed)
Central Venous Catheter Insertion Procedure Note Abigail Buttsrene Monte [id number] 25-Nov-1927  Procedure: Insertion of Central Venous Catheter Indications: Drug and/or fluid administration  Procedure Details Consent: Unable to obtain consent because of emergent medical necessity. Time Out: Verified patient identification, verified procedure, site/side was marked, verified correct patient position, special equipment/implants available, medications/allergies/relevent history reviewed, required imaging and test results available.  Performed  Maximum sterile technique was used including antiseptics, cap, gloves, gown, hand hygiene, mask and sheet. Skin prep: Chlorhexidine; local anesthetic administered A antimicrobial bonded/coated triple lumen catheter was placed in the right subclavian vein using the Seldinger technique.  Evaluation Blood flow good Complications: No apparent complications Patient did tolerate procedure well. Chest X-ray ordered to verify placement.  CXR: pending.  U/S used in placement.  YACOUB,WESAM 11/11/2013, 1:53 AM

## 2013-11-12 NOTE — Progress Notes (Signed)
eLink Physician-Brief Progress Note Patient Name: Sabrina Hurley DOB: 1927-11-26 MRN: [id number]  Date of Service  10/19/2013   HPI/Events of Note   Pt's daughter informed me by phone that she and family are ready to withdraw care  eICU Interventions  Withdrawal protocol enacted      Mackinsey Pelland S. 10/23/2013, 5:14 AM

## 2013-11-12 NOTE — Consult Note (Signed)
Called by eLink MD, patient had a cardiac arrest on the floor.  Patient was transferred to the ICU.  Upon arrival to the ICU the patient had a repeat arrest x2.  Please see code note.  The patient had progressive renal failure on the floor and elevated digoxin level.  Latest dig level was 1.9 however.  Latest K was 4.7 but that was on the morning of 1/12.  Hyperkalemia protocol was followed regardless however.  Patient continued to brady and lost pulse x2.    After evaluating the case, it is likely to be a primary cardiac event leading her arrest and subsequent arrest were likely acidosis driven.  Bicarb was given.    After discussion with the family, decision was made to not pursue further ACLS.  Patient changed to no CPR/cardioversion or further escalation of care.  Family did not wish to be bedside and will be notified once patient expires.  CC time of 45 min.  Alyson ReedyWesam G. Rozanna Cormany, M.D. [facility]. Pager: [phone]. After hours pager: [phone].

## 2013-11-12 NOTE — Progress Notes (Signed)
Met with patient's daughter, Suzy Bouchard and her husband.  Levada Dy did not want to see her mother anymore with all her tubes in and leads coming off of her.  The Chaplain affirmed this choice and we sat and talked.  She shared stories of her mom, their relationship, their concern for each other.  She also shared her last cogent moments with her mom when she told her to smile and kissed her, got her smile, exchanged "I love you's" and then departed.  She spoke of her family constellation and where her relatives and siblings were at.  Her mother is the last living member of that generation in her family.  The husband has both parents living and as these two have know each other since grade school and dated in HS, he is also very close to the patient.  They have one 74 year old daughter.  They were handling the situation very realistically, and the Chaplain assisted the nurse in broaching the subject of withdrawing mechanical support and going to a "no code" status.  The daughter's only reservation was her brother, who was not as far along in accepting the state of affairs.  She fell back on her mother's stated wishes and requested withdrawal of care.    The Chaplain prayed with the family and promised top pray with the patient.  Afer the decision, the Chaplain affirmed the choice and shared that he had made the same choice with his mother, by way of further affirmation.  Also stressed the honoring of her mother in following her mother's wishes.  Chaplain also suggested to the daughter that in telling her daughter and the brother that everything had been done to "save" their mom (grandmother) and there was nothing else they could do.  Chaplain prayed with the patient, praised the nurse for her work and patient contact and departed.  Loann Quill, Chaplain Pager: [phone]

## 2013-11-12 NOTE — Progress Notes (Signed)
MD on called paged regarding pt's cont nausea. Pt moaning and spitting up phlegm. Will cont to monitor.

## 2013-11-12 NOTE — Procedures (Signed)
Chest Tube Placement  PTX likely due to CPR noted on CXR.  Patient was positioned.  Area cleaned, lidocaine injected followed by skin incision.  Blunt dissection done and chest cavity entered.  Size 20 chest tube placed without difficulty and sutured.  Good pleural variation with ventilation.  CXR pending.  Alyson ReedyWesam G. Yacoub, M.D. [facility]. Pager: [phone]. After hours pager: [phone].

## 2013-11-12 DEATH — deceased

## 2013-12-13 NOTE — Discharge Summary (Signed)
NAME:  Sabrina Hurley, Sabrina Hurley NO.:  [id number]  MEDICAL RECORD NO.:  [id number]  LOCATION:  1226                         FACILITY:  [facility]  PHYSICIAN:  Providence Lanius, MD  DATE OF BIRTH:  1928-04-27  DATE OF ADMISSION:  10/19/2013 DATE OF DISCHARGE:  Oct 29, 2013                              DISCHARGE SUMMARY   DEATH SUMMARY:  PRIMARY DIAGNOSIS/CAUSE OF DEATH: Pulseless electrical activity, cardiac arrest.  SECONDARY DIAGNOSES: 1. Respiratory failure. 2. Cardiogenic shock. The patient is an 80 year old female, who presented to the hospital under [facility] for nausea and vomiting.  While on the floor, the patient had a pulseless electrical activity, cardiac arrest.  Code blue was called.  The patient was successfully resuscitated, was brought down to the ICU, and immediately upon arrival, the patient had another episode of cardiac arrest.  All labs were reviewed.  Resuscitative efforts were continued.  The patient had another return of spontaneous circulation.  I met with the family and informed that the patient is likely to have another episode of cardiac arrest as I suspect this as primary cardiac event.  The patient was unable to maintain her blood pressure at which point, they informed that the patient would not want further resuscitative effort and would pursue comfort and did not want the patient to suffer any further at this point.  Family informed that they do not wish to be at bedside, they only wished to be notified upon patient's expiration, which shortly after, the patient expired without any discontinuation of any medical care.  She had a repeat episode of cardiac arrest and no resuscitative efforts were done.  Family was called and notified of the patient's expiration.     Providence Lanius, MD     WJY/MEDQ  D:  12/12/2013  T:  12/13/2013  Job:  [id number]

## 2014-07-17 IMAGING — DX DG CHEST 1V PORT
1 series · 1 of 1 positions shown · non-contrast
Comparison: Two-view chest x-ray 10/21/2013.

CLINICAL DATA: Intubation. Cardiorespiratory arrest with
resuscitation.

EXAM:
PORTABLE CHEST - 1 VIEW

[AP]
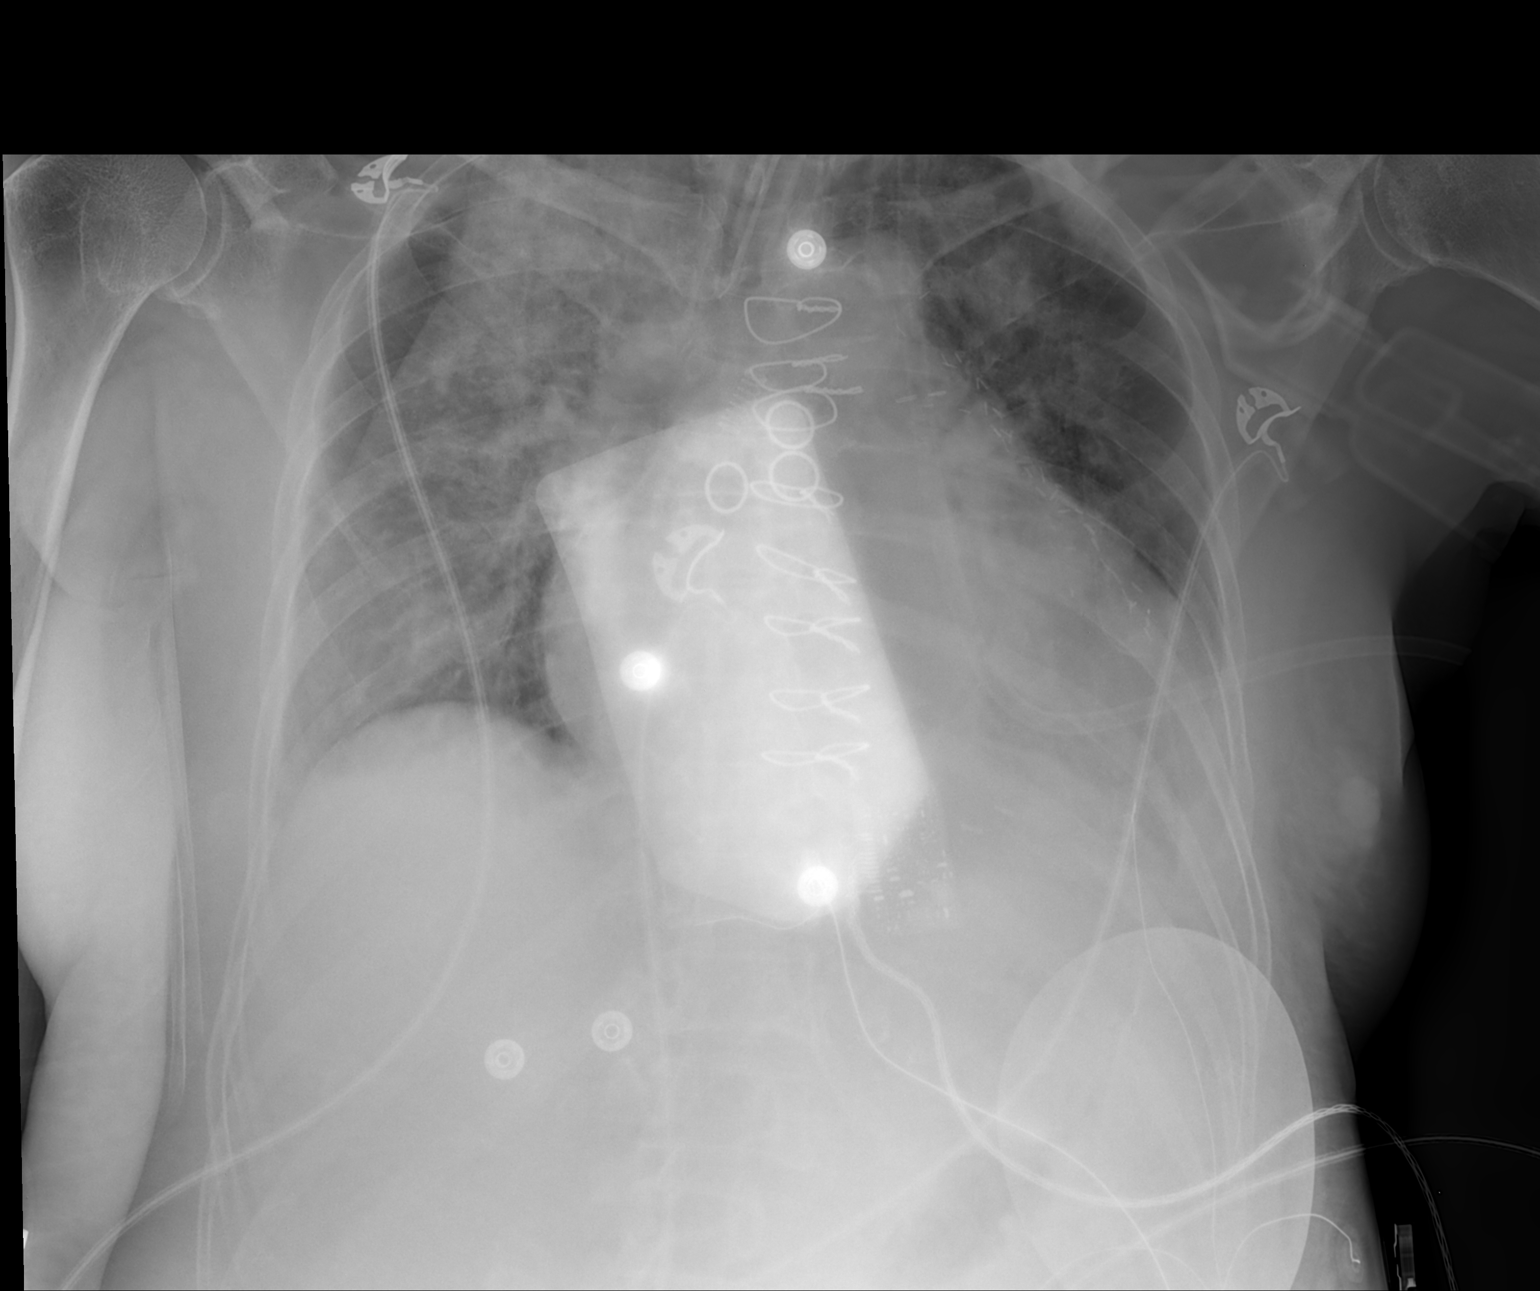

[1 of 1 positions shown; findings below may reference images not displayed]

FINDINGS: Endotracheal tube in satisfactory position with the tip projecting
approximately 5 cm above the carina. Prior sternotomy for CABG.
Cardiac silhouette moderately enlarged but stable. Diffuse
interstitial and airspace pulmonary edema throughout both lungs.
More confluent airspace consolidation in the right upper lobe. Small
bilateral pleural effusions suspected. No pneumothorax. External
pacing pads.
IMPRESSION: 1. Endotracheal tube tip in satisfactory position projecting
approximately 5 cm above the carina.
2. CHF, with moderate cardiomegaly and diffuse interstitial and
airspace pulmonary edema.
3. More confluent airspace consolidation in the right lung apex,
query aspiration.
